# Patient Record
Sex: Female | Born: 2000 | Race: Black or African American | Hispanic: No | Marital: Single | Smoking: Current some day smoker
Health system: Southern US, Community
[De-identification: ages and names within clinical notes are randomized; demographics above are authoritative.]

## PROBLEM LIST (undated history)

## (undated) DIAGNOSIS — F32A Depression, unspecified: Secondary | ICD-10-CM

## (undated) DIAGNOSIS — D649 Anemia, unspecified: Secondary | ICD-10-CM

## (undated) DIAGNOSIS — Z5189 Encounter for other specified aftercare: Secondary | ICD-10-CM

## (undated) DIAGNOSIS — F329 Major depressive disorder, single episode, unspecified: Secondary | ICD-10-CM

## (undated) DIAGNOSIS — F913 Oppositional defiant disorder: Secondary | ICD-10-CM

## (undated) DIAGNOSIS — F909 Attention-deficit hyperactivity disorder, unspecified type: Secondary | ICD-10-CM

## (undated) HISTORY — PX: NO PAST SURGERIES: SHX2092

---
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
## 2018-02-08 ENCOUNTER — Encounter (HOSPITAL_COMMUNITY): Payer: Self-pay | Admitting: *Deleted

## 2018-02-08 ENCOUNTER — Emergency Department (HOSPITAL_COMMUNITY)
Admission: EM | Admit: 2018-02-08 | Discharge: 2018-02-08 | Disposition: A | Discharge status: Home / Self Care | Payer: Medicaid Other | Attending: Emergency Medicine | Admitting: Emergency Medicine

## 2018-02-08 DIAGNOSIS — R112 Nausea with vomiting, unspecified: Secondary | ICD-10-CM | POA: Insufficient documentation

## 2018-02-08 DIAGNOSIS — R1084 Generalized abdominal pain: Secondary | ICD-10-CM | POA: Diagnosis not present

## 2018-02-08 LAB — COMPREHENSIVE METABOLIC PANEL
ALT: 16 U/L (ref 14–54)
AST: 20 U/L (ref 15–41)
Albumin: 4.5 g/dL (ref 3.5–5.0)
Alkaline Phosphatase: 56 U/L (ref 38–126)
Anion gap: 11 (ref 5–15)
BUN: 12 mg/dL (ref 6–20)
CHLORIDE: 103 mmol/L (ref 101–111)
CO2: 25 mmol/L (ref 22–32)
Calcium: 9.4 mg/dL (ref 8.9–10.3)
Creatinine, Ser: 0.76 mg/dL (ref 0.44–1.00)
GFR calc Af Amer: >60 mL/min (ref >60)
Glucose, Bld: 114 mg/dL — ABNORMAL HIGH (ref 65–99)
POTASSIUM: 3.6 mmol/L (ref 3.5–5.1)
Sodium: 139 mmol/L (ref 135–145)
Total Bilirubin: 0.6 mg/dL (ref 0.3–1.2)
Total Protein: 7.9 g/dL (ref 6.5–8.1)

## 2018-02-08 LAB — CBC
HEMATOCRIT: 39.4 % (ref 36.0–46.0)
HEMOGLOBIN: 13.3 g/dL (ref 12.0–15.0)
MCH: 29.2 pg (ref 26.0–34.0)
MCHC: 33.8 g/dL (ref 30.0–36.0)
MCV: 86.4 fL (ref 78.0–100.0)
Platelets: 211 10*3/uL (ref 150–400)
RBC: 4.56 MIL/uL (ref 3.87–5.11)
RDW: 12.8 % (ref 11.5–15.5)
WBC: 7.9 10*3/uL (ref 4.0–10.5)

## 2018-02-08 LAB — URINALYSIS, ROUTINE W REFLEX MICROSCOPIC
BILIRUBIN URINE: NEGATIVE
Glucose, UA: NEGATIVE mg/dL
HGB URINE DIPSTICK: NEGATIVE
KETONES UR: 5 mg/dL — AB
LEUKOCYTES UA: NEGATIVE
NITRITE: NEGATIVE
PROTEIN: 30 mg/dL — AB
Specific Gravity, Urine: 1.026 (ref 1.005–1.030)
pH: 7 (ref 5.0–8.0)

## 2018-02-08 LAB — POC URINE PREG, ED: PREG TEST UR: NEGATIVE

## 2018-02-08 LAB — LIPASE, BLOOD: LIPASE: 26 U/L (ref 11–51)

## 2018-02-08 MED ORDER — ONDANSETRON HCL 4 MG/2ML IJ SOLN
4.0000 mg | Freq: Once | INTRAMUSCULAR | Status: AC
  Administered 2018-02-08: 4 mg via INTRAVENOUS
  Filled 2018-02-08: qty 2

## 2018-02-08 MED ORDER — ONDANSETRON 4 MG PO TBDP: 4.0000 mg | ORAL_TABLET | Freq: Three times a day (TID) | ORAL | 0 refills | Status: DC | PRN

## 2018-02-08 MED ORDER — SODIUM CHLORIDE 0.9 % IV BOLUS
500.0000 mL | Freq: Once | INTRAVENOUS | Status: AC
  Administered 2018-02-08: 500 mL via INTRAVENOUS
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
## 2018-02-08 NOTE — Discharge Instructions (Signed)
As discussed, make sure that you stay well-hydrated start reintroducing fluids and then move on to bland foods.  Take Zofran as needed for nausea vomiting. Follow-up with your primary care provider. Return sooner if symptoms worsen or new concerning symptoms in the meantime.

## 2018-02-08 NOTE — ED Provider Notes (Signed)
[facility]-EMERGENCY DEPT Provider Note   CSN: [id number] Arrival date & time: 02/08/18  1502     History   Chief Complaint Chief Complaint  Patient presents with  . Abdominal Pain  . Emesis    HPI Annette Archer is a 17 y.o. female with past medical history significant for ADHD presenting with generalized abdominal discomfort, nausea/vomiting today.  She is accompanied by [facility] and was sent by the jail nurse who witnessed her vomiting. She states that she was told she needed to be evaluated prior to being accepted to the detention facility. She reports that she drank a lot of alcohol yesterday for her birthday and feels hung over.  She denies any focal abdominal pain. No fever, diarrhea, dysuria, hematuria, vaginal discharge.  LMP unknown patient reports irregular cycles.  HPI  Past Medical History:  Diagnosis Date  . ADHD (attention deficit hyperactivity disorder)   . ADHD (attention deficit hyperactivity disorder)   . Depression   . ODD (oppositional defiant disorder)     Patient Active Problem List   Diagnosis Date Noted  . ODD (oppositional defiant disorder)     History reviewed. No pertinent surgical history.   OB History   None      Home Medications    Prior to Admission medications   Medication Sig Start Date End Date Taking? Authorizing Provider  cephALEXin (KEFLEX) 500 MG capsule Take 1 capsule (500 mg total) by mouth 4 (four) times daily. Patient not taking: Reported on 12/20/2017 10/15/15   Mackuen, Courteney Lyn, MD  ibuprofen (ADVIL,MOTRIN) 600 MG tablet Take 1 tablet (600 mg total) by mouth every 6 (six) hours as needed for moderate pain. 12/20/17   Antony Madura, PA-C  ondansetron (ZOFRAN ODT) 4 MG disintegrating tablet Take 1 tablet (4 mg total) by mouth every 8 (eight) hours as needed for nausea or vomiting. 02/08/18   Georgiana Shore, PA-C  pantoprazole (PROTONIX) 40 MG tablet Take 1 tablet (40 mg total) by mouth daily. Patient  not taking: Reported on 12/20/2017 10/14/17   Ward, Layla Maw, DO  promethazine (PHENERGAN) 25 MG tablet Take 1 tablet (25 mg total) by mouth every 6 (six) hours as needed for nausea or vomiting. 12/20/17   Antony Madura, PA-C    Family History No family history on file.  Social History Social History   Tobacco Use  . Smoking status: Never Smoker  . Smokeless tobacco: Never Used  Substance Use Topics  . Alcohol use: No  . Drug use: Yes    Types: Marijuana     Allergies   Caffeine; Red dye; and Yellow dyes (non-tartrazine)   Review of Systems Review of Systems  Constitutional: Negative for chills, diaphoresis, fatigue and fever.  Respiratory: Negative for cough, choking, chest tightness, shortness of breath, wheezing and stridor.   Cardiovascular: Negative for chest pain and palpitations.  Gastrointestinal: Positive for abdominal distention, nausea and vomiting. Negative for abdominal pain, blood in stool, constipation and diarrhea.       Patient reported generalized abdominal discomfort with nausea and vomiting earlier which have now resolved.  Normal bowel movement yesterday  Genitourinary: Negative for difficulty urinating, dysuria, flank pain, frequency, hematuria, pelvic pain, vaginal bleeding and vaginal discharge.  Musculoskeletal: Negative for arthralgias, back pain, gait problem, joint swelling, myalgias, neck pain and neck stiffness.  Skin: Negative for color change, pallor and rash.  Neurological: Negative for dizziness, seizures, syncope, weakness, light-headedness and headaches.     Physical Exam Updated Vital Signs BP  117/61 (BP Location: Left Arm)   Pulse 65   Temp 98.4 F (36.9 C) (Oral)   Resp 12   Ht 5\' 4"  (1.626 m)   Wt 59 kg (130 lb)   SpO2 100%   BMI 22.31 kg/m   Physical Exam  Constitutional: She appears well-developed and well-nourished.  Non-toxic appearance. She does not appear ill. No distress.  Afebrile, nontoxic-appearing, sitting  comfortably in bed no acute distress.  HENT:  Head: Normocephalic and atraumatic.  Mouth/Throat: Oropharynx is clear and moist. No oropharyngeal exudate.  Eyes: Conjunctivae and EOM are normal. No scleral icterus.  Neck: Neck supple.  Cardiovascular: Normal rate, regular rhythm and normal heart sounds.  No murmur heard. Pulmonary/Chest: Effort normal and breath sounds normal. No stridor. No respiratory distress. She has no wheezes. She has no rales.  Abdominal: Soft. Normal appearance and bowel sounds are normal. She exhibits no distension, no pulsatile midline mass and no mass. There is no tenderness. There is no rigidity, no rebound, no guarding, no CVA tenderness, no tenderness at McBurney's point and negative Murphy's sign.  Musculoskeletal: She exhibits no edema.  Neurological: She is alert.  Skin: Skin is warm and dry. No rash noted. She is not diaphoretic. No erythema. No pallor.  Psychiatric: She has a normal mood and affect.  Nursing note and vitals reviewed.    ED Treatments / Results  Labs (all labs ordered are listed, but only abnormal results are displayed) Labs Reviewed  COMPREHENSIVE METABOLIC PANEL - Abnormal; Notable for the following components:      Result Value   Glucose, Bld 114 (*)    All other components within normal limits  URINALYSIS, ROUTINE W REFLEX MICROSCOPIC - Abnormal; Notable for the following components:   APPearance HAZY (*)    Ketones, ur 5 (*)    Protein, ur 30 (*)    Bacteria, UA RARE (*)    Squamous Epithelial / LPF 6-30 (*)    All other components within normal limits  LIPASE, BLOOD  CBC  POC URINE PREG, ED    EKG None  Radiology No results found.  Procedures Procedures (including critical care time)  Medications Ordered in ED Medications  ondansetron (ZOFRAN) injection 4 mg (4 mg Intravenous Given 02/08/18 2051)  sodium chloride 0.9 % bolus 500 mL (0 mLs Intravenous Stopped 02/08/18 2143)     Initial Impression / Assessment  and Plan / ED Course  I have reviewed the triage vital signs and the nursing notes.  Pertinent labs & imaging results that were available during my care of the patient were reviewed by me and considered in my medical decision making (see chart for details).    Patient presented with [facility] sent by nurse from detention facility due to vomiting.  She has been attributing this to heavy drinking yesterday.  No focal abdominal pain.  Abdomen is soft and nontender on palpation.  Afebrile, nontoxic with normal vital signs.  Patient was given Zofran and IV fluids reported improvement. She is asking for food. Labs unremarkable On reassessment, patient reports improvement no vomiting or abdominal pain.  Successful p.o. Challenge.  Will discharge with symptomatic relief and close follow-up with PCP with Zofran as needed.  Discussed strict return precautions and advised to return to the emergency department if experiencing any new or worsening symptoms. Instructions were understood and patient agreed with discharge plan. Final Clinical Impressions(s) / ED Diagnoses   Final diagnoses:  Non-intractable vomiting with nausea, unspecified vomiting type  Generalized abdominal pain  ED Discharge Orders        Ordered    ondansetron (ZOFRAN ODT) 4 MG disintegrating tablet  Every 8 hours PRN     02/08/18 2229       Georgiana ShoreMitchell, Glyn Gerads B, PA-C 02/08/18 2302    Melene PlanFloyd, Dan, DO 02/09/18 0003

## 2018-02-08 NOTE — ED Triage Notes (Signed)
Nurse from jail sent pt to be evaluated for abdominal pain and emesis. Pt states she feels like she has a really bad hangover. Pt states she drank a lot of alcohol last night.

## 2018-10-17 ENCOUNTER — Other Ambulatory Visit: Payer: Self-pay

## 2018-10-17 ENCOUNTER — Emergency Department (HOSPITAL_COMMUNITY)
Admission: EM | Admit: 2018-10-17 | Discharge: 2018-10-18 | Disposition: A | Discharge status: Home / Self Care | Payer: Medicaid Other | Attending: Emergency Medicine | Admitting: Emergency Medicine

## 2018-10-17 DIAGNOSIS — F1910 Other psychoactive substance abuse, uncomplicated: Secondary | ICD-10-CM | POA: Insufficient documentation

## 2018-10-17 DIAGNOSIS — F319 Bipolar disorder, unspecified: Secondary | ICD-10-CM | POA: Insufficient documentation

## 2018-10-17 DIAGNOSIS — Z046 Encounter for general psychiatric examination, requested by authority: Secondary | ICD-10-CM | POA: Insufficient documentation

## 2018-10-17 NOTE — ED Notes (Signed)
Bed: [facility] Expected date:  Expected time:  Means of arrival:  Comments: 
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
## 2018-10-18 ENCOUNTER — Encounter (HOSPITAL_COMMUNITY): Payer: Self-pay | Admitting: Emergency Medicine

## 2018-10-18 LAB — COMPREHENSIVE METABOLIC PANEL
ALK PHOS: 62 U/L (ref 38–126)
ALT: 63 U/L — ABNORMAL HIGH (ref 0–44)
ANION GAP: 6 (ref 5–15)
AST: 41 U/L (ref 15–41)
Albumin: 4 g/dL (ref 3.5–5.0)
BUN: 11 mg/dL (ref 6–20)
CALCIUM: 8.5 mg/dL — AB (ref 8.9–10.3)
CO2: 27 mmol/L (ref 22–32)
Chloride: 107 mmol/L (ref 98–111)
Creatinine, Ser: 0.79 mg/dL (ref 0.44–1.00)
GFR calc Af Amer: >60 mL/min (ref >60)
GFR calc non Af Amer: >60 mL/min (ref >60)
GLUCOSE: 86 mg/dL (ref 70–99)
Potassium: 4.3 mmol/L (ref 3.5–5.1)
Sodium: 140 mmol/L (ref 135–145)
Total Bilirubin: 0.6 mg/dL (ref 0.3–1.2)
Total Protein: 6.8 g/dL (ref 6.5–8.1)

## 2018-10-18 LAB — I-STAT BETA HCG BLOOD, ED (MC, WL, AP ONLY)

## 2018-10-18 LAB — CBC
HCT: 37.6 % (ref 36.0–46.0)
HEMOGLOBIN: 12.9 g/dL (ref 12.0–15.0)
MCH: 30 pg (ref 26.0–34.0)
MCHC: 34.3 g/dL (ref 30.0–36.0)
MCV: 87.4 fL (ref 80.0–100.0)
NRBC: 0 % (ref 0.0–0.2)
PLATELETS: 195 10*3/uL (ref 150–400)
RBC: 4.3 MIL/uL (ref 3.87–5.11)
RDW: 13.1 % (ref 11.5–15.5)
WBC: 4.3 10*3/uL (ref 4.0–10.5)

## 2018-10-18 LAB — ETHANOL: Alcohol, Ethyl (B): <10 mg/dL (ref <10)

## 2018-10-18 MED ORDER — ONDANSETRON HCL 4 MG PO TABS: 4.0000 mg | ORAL_TABLET | Freq: Three times a day (TID) | ORAL | Status: DC | PRN

## 2018-10-18 MED ORDER — ACETAMINOPHEN 325 MG PO TABS: 650.0000 mg | ORAL_TABLET | ORAL | Status: DC | PRN

## 2018-10-18 MED ORDER — ALUM & MAG HYDROXIDE-SIMETH 200-200-20 MG/5ML PO SUSP: 30.0000 mL | Freq: Four times a day (QID) | ORAL | Status: DC | PRN
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
## 2018-10-18 NOTE — ED Notes (Signed)
Patient being d/c'd. Patient stable for d/c. Called patient's April HoldingGrandmother, Linda Guy, at [phone] to pick up patient.
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
## 2018-10-18 NOTE — ED Notes (Addendum)
Patient's BP 88/62 manually. Notified Dr. Read DriversMolpus. He stated okay for patient to d/c as long as she could ambulate without getting dizzy. She ambulated without getting dizzy. Patient stable for d/c.

## 2019-02-12 ENCOUNTER — Ambulatory Visit (HOSPITAL_COMMUNITY)
Admission: EM | Admit: 2019-02-12 | Discharge: 2019-02-12 | Disposition: A | Discharge status: Home / Self Care | Payer: Medicaid Other | Attending: Family Medicine | Admitting: Family Medicine

## 2019-02-12 ENCOUNTER — Encounter (HOSPITAL_COMMUNITY): Payer: Self-pay | Admitting: Emergency Medicine

## 2019-02-12 ENCOUNTER — Other Ambulatory Visit: Payer: Self-pay

## 2019-02-12 DIAGNOSIS — Z113 Encounter for screening for infections with a predominantly sexual mode of transmission: Secondary | ICD-10-CM | POA: Diagnosis present

## 2019-02-12 DIAGNOSIS — A6004 Herpesviral vulvovaginitis: Secondary | ICD-10-CM | POA: Diagnosis present

## 2019-02-12 MED ORDER — VALACYCLOVIR HCL 1 G PO TABS: 1000.0000 mg | ORAL_TABLET | Freq: Three times a day (TID) | ORAL | 0 refills | Status: DC

## 2019-02-12 NOTE — ED Provider Notes (Signed)
[facility]   [id number] 02/12/19 Arrival Time: 1047  ASSESSMENT & PLAN:  1. Herpes simplex vulvovaginitis   2. Screening for STDs (sexually transmitted diseases)    See AVS for written information. No signs or symptoms of PID.   Discharge Instructions     We have sent testing for sexually transmitted infections. We will notify you of any positive results once they are received. If required, we will prescribe any medications you might need.  Please refrain from all sexual activity for at least the next seven days.  I have included written information on genital herpes. Please feel free to call with any further questions or concerns you may have.     Pending: Labs Reviewed  RPR  HIV ANTIBODY (ROUTINE TESTING W REFLEX)  CERVICOVAGINAL ANCILLARY ONLY   Meds ordered this encounter  Medications  . valACYclovir (VALTREX) 1000 MG tablet    Sig: Take 1 tablet (1,000 mg total) by mouth 3 (three) times daily.    Dispense:  21 tablet    Refill:  0   Will notify of any positive results of screening tests. Instructed to refrain from sexual activity for at least seven days.  Reviewed expectations re: course of current medical issues. Questions answered. Outlined signs and symptoms indicating need for more acute intervention. Patient verbalized understanding. After Visit Summary given.   SUBJECTIVE:  Annette Archer is a 18 y.o. female who presents with complaint of painful "bumps" of perineum. Noticed this morning. Recent unprotected sex with a female partner who has had genital herpes. She is worried this may be herpes. No vaginal discharge reported. Denies: urinary frequency, hematuria, nausea, vomiting and dysuria. Afebrile. No abdominal or pelvic pain. No n/v. No rashes or lesions. OTC treatment: none reported. History of STI: none reported.  Patient's last menstrual period was 01/29/2019.  ROS: As per HPI. All other systems negative.   OBJECTIVE:  Vitals:   02/12/19 1113  BP: 107/73  Pulse: 76  Resp: 18  Temp: 99.3 F (37.4 C)  TempSrc: Oral  SpO2: 95%    General appearance: alert, cooperative, appears stated age and no distress Throat: lips, mucosa, and tongue normal; teeth and gums normal CV: RRR Lungs: CTAB Back: no CVA tenderness; FROM at waist Abdomen: soft, non-tender GU: two crops of shallow round ulcerations measuring approx 2-3 mm each over lower external vagina and around rectum; tender to touch; no bleeding; no areas of fluctuance; no vaginal discharge Skin: warm and dry Psychological: alert and cooperative; normal mood and affect.  Labs Reviewed - No data to display  Allergies  Allergen Reactions  . Caffeine Other (See Comments)    insomnia  . Red Dye     Can not take because of adderall  . Yellow Dyes (Non-Tartrazine) Other (See Comments)    Per pts mother she can not have because she takes adderall    Past Medical History:  Diagnosis Date  . ADHD (attention deficit hyperactivity disorder)   . Depression   . ODD (oppositional defiant disorder)    FH: Unknown.  Social History   Socioeconomic History  . Marital status: Single    Spouse name: Not on file  . Number of children: Not on file  . Years of education: Not on file  . Highest education level: Not on file  Occupational History  . Not on file  Social Needs  . Financial resource strain: Not on file  . Food insecurity:    Worry: Not on file  Inability: Not on file  . Transportation needs:    Medical: Not on file    Non-medical: Not on file  Tobacco Use  . Smoking status: Never Smoker  . Smokeless tobacco: Never Used  Substance and Sexual Activity  . Alcohol use: No  . Drug use: Yes    Types: Marijuana  . Sexual activity: Never  Lifestyle  . Physical activity:    Days per week: Not on file    Minutes per session: Not on file  . Stress: Not on file  Relationships  . Social connections:    Talks on phone: Not on file    Gets together:  Not on file    Attends religious service: Not on file    Active member of club or organization: Not on file    Attends meetings of clubs or organizations: Not on file    Relationship status: Not on file  . Intimate partner violence:    Fear of current or ex partner: Not on file    Emotionally abused: Not on file    Physically abused: Not on file    Forced sexual activity: Not on file  Other Topics Concern  . Not on file  Social History Narrative  . Not on file          Mardella Layman, MD 02/13/19 [phone]

## 2019-02-12 NOTE — ED Notes (Signed)
At bedside for physician evaluation of ulcers /bumps of vagina/rectum.

## 2019-02-12 NOTE — ED Triage Notes (Signed)
Recent incarceration.  Patient has had unprotected sex with a partner.  Partner has herpes.  Patient thinks there are bumps/blister to perineum.  Noticed these areas this morning.  Blisters at rectum too

## 2019-02-12 NOTE — Discharge Instructions (Signed)
We have sent testing for sexually transmitted infections. We will notify you of any positive results once they are received. If required, we will prescribe any medications you might need.  Please refrain from all sexual activity for at least the next seven days.  I have included written information on genital herpes. Please feel free to call with any further questions or concerns you may have.

## 2019-02-13 LAB — CERVICOVAGINAL ANCILLARY ONLY
Bacterial vaginitis: POSITIVE — AB
Candida vaginitis: POSITIVE — AB
Chlamydia: NEGATIVE
Neisseria Gonorrhea: NEGATIVE
Trichomonas: NEGATIVE

## 2019-02-13 LAB — HIV ANTIBODY (ROUTINE TESTING W REFLEX): HIV Screen 4th Generation wRfx: NONREACTIVE

## 2019-02-13 LAB — RPR: RPR Ser Ql: NONREACTIVE

## 2019-02-14 ENCOUNTER — Telehealth (HOSPITAL_COMMUNITY): Payer: Self-pay | Admitting: Emergency Medicine

## 2019-02-14 MED ORDER — FLUCONAZOLE 150 MG PO TABS: 150.0000 mg | ORAL_TABLET | Freq: Once | ORAL | 0 refills | Status: AC

## 2019-02-14 MED ORDER — METRONIDAZOLE 500 MG PO TABS: 500.0000 mg | ORAL_TABLET | Freq: Two times a day (BID) | ORAL | 0 refills | Status: AC

## 2019-02-14 NOTE — Telephone Encounter (Signed)
Bacterial vaginosis is positive. This was not treated at the urgent care visit.  Flagyl 500 mg BID x 7 days #14 no refills sent to patients pharmacy of choice.    Test for candida (yeast) was positive.  Prescription for fluconazole 150mg po now, repeat dose in 3d if needed, #2 no refills, sent to the pharmacy of record.  Recheck or followup with PCP for further evaluation if symptoms are not improving.    Patient contacted and made aware of all results, all questions answered.   

## 2019-03-13 ENCOUNTER — Ambulatory Visit (HOSPITAL_COMMUNITY)
Admission: EM | Admit: 2019-03-13 | Discharge: 2019-03-13 | Disposition: A | Discharge status: Home / Self Care | Payer: Medicaid Other | Attending: Family Medicine | Admitting: Family Medicine

## 2019-03-13 ENCOUNTER — Encounter (HOSPITAL_COMMUNITY): Payer: Self-pay

## 2019-03-13 ENCOUNTER — Other Ambulatory Visit: Payer: Self-pay

## 2019-03-13 DIAGNOSIS — S60429A Blister (nonthermal) of unspecified finger, initial encounter: Secondary | ICD-10-CM | POA: Diagnosis present

## 2019-03-13 DIAGNOSIS — A6004 Herpesviral vulvovaginitis: Secondary | ICD-10-CM | POA: Diagnosis present

## 2019-03-13 DIAGNOSIS — N760 Acute vaginitis: Secondary | ICD-10-CM | POA: Diagnosis present

## 2019-03-13 LAB — POCT PREGNANCY, URINE: Preg Test, Ur: NEGATIVE

## 2019-03-13 MED ORDER — VALACYCLOVIR HCL 1 G PO TABS: 1000.0000 mg | ORAL_TABLET | Freq: Every day | ORAL | 0 refills | Status: AC
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
## 2019-03-13 NOTE — ED Notes (Signed)
Period is late
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
## 2019-03-13 NOTE — Discharge Instructions (Signed)
Negative pregnancy.  Complete 7 days of valtrex for herpes.  I expect this likely to help your finger as well.  Elevation and ibuprofen for pain as needed.  Will notify you of any further positive findings from your vaginal test and if any changes to treatment are needed.   Please without from intercourse until your symptoms have resolved.  Please follow up with your primary care provider for management as needed.

## 2019-03-14 ENCOUNTER — Telehealth (HOSPITAL_COMMUNITY): Payer: Self-pay | Admitting: Emergency Medicine

## 2019-03-14 LAB — CERVICOVAGINAL ANCILLARY ONLY
Bacterial vaginitis: POSITIVE — AB
Candida vaginitis: POSITIVE — AB
Chlamydia: NEGATIVE
Neisseria Gonorrhea: NEGATIVE
Trichomonas: NEGATIVE

## 2019-03-14 MED ORDER — FLUCONAZOLE 150 MG PO TABS: 150.0000 mg | ORAL_TABLET | Freq: Once | ORAL | 0 refills | Status: AC

## 2019-03-14 MED ORDER — METRONIDAZOLE 500 MG PO TABS: 500.0000 mg | ORAL_TABLET | Freq: Two times a day (BID) | ORAL | 0 refills | Status: AC

## 2019-03-14 NOTE — Telephone Encounter (Signed)
Bacterial vaginosis is positive. This was not treated at the urgent care visit.  Flagyl 500 mg BID x 7 days #14 no refills sent to patients pharmacy of choice.    Test for candida (yeast) was positive.  Prescription for fluconazole 150mg po now, repeat dose in 3d if needed, #2 no refills, sent to the pharmacy of record.  Recheck or followup with PCP for further evaluation if symptoms are not improving.    Patient contacted and made aware of all results, all questions answered.   
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
## 2019-11-26 ENCOUNTER — Ambulatory Visit (HOSPITAL_COMMUNITY)
Admission: EM | Admit: 2019-11-26 | Discharge: 2019-11-26 | Disposition: A | Discharge status: Home / Self Care | Payer: Medicaid Other | Attending: Family Medicine | Admitting: Family Medicine

## 2019-11-26 ENCOUNTER — Encounter (HOSPITAL_COMMUNITY): Payer: Self-pay

## 2019-11-26 DIAGNOSIS — J029 Acute pharyngitis, unspecified: Secondary | ICD-10-CM | POA: Diagnosis not present

## 2019-11-26 LAB — POCT RAPID STREP A: Streptococcus, Group A Screen (Direct): NEGATIVE

## 2019-11-26 NOTE — Discharge Instructions (Addendum)
Your strep test was negative.  This is mostly likely some sort of viral illness.  I do not believe this is herpes related. Follow up as needed for continued or worsening symptoms

## 2019-11-26 NOTE — ED Triage Notes (Signed)
Pt present a Herpes outbreak in her throat, on Sunday her throat was red and inflamed with bump. Pt state those are the same outbreak she has on her vaginal when she has an outbreak.

## 2019-11-27 NOTE — ED Provider Notes (Signed)
[facility]    CSN: [id number] Arrival date & time: 11/26/19  1339      History   Chief Complaint Chief Complaint  Patient presents with  . Nausea  . Sore Throat    HPI Annette Archer is a 19 y.o. female.   19 year old female presents today for sore throat.  Symptoms been constant, improving for the past 3 days.  Reporting pain with swallowing and noticed white patches in the back of her throat.  Denies any fever, cough, nasal congestion or rhinorrhea.  She is not take anything for her symptoms.  She denies any sick contacts or Covid exposures.  Concerned for oral herpes flare. 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  She is not currently sexually active.  ROS per HPI      Past Medical History:  Diagnosis Date  . ADHD (attention deficit hyperactivity disorder)   . Depression   . ODD (oppositional defiant disorder)     Patient Active Problem List   Diagnosis Date Noted  . ODD (oppositional defiant disorder)     History reviewed. No pertinent surgical history.  OB History   No obstetric history on file.      Home Medications    Prior to Admission medications   Not on File    Family History Family History  Problem Relation Age of Onset  . Healthy Mother   . Healthy Father     Social History Social History   Tobacco Use  . Smoking status: Never Smoker  . Smokeless tobacco: Never Used  Substance Use Topics  . Alcohol use: Yes  . Drug use: Not Currently    Types: Marijuana     Allergies   Caffeine, Red dye, and Yellow dyes (non-tartrazine)   Review of Systems Review of Systems   Physical Exam Triage Vital Signs ED Triage Vitals  Enc Vitals Group     BP 11/26/19 1413 107/61     Pulse Rate 11/26/19 1413 72     Resp 11/26/19 1413 18     Temp 11/26/19 1413 99.1 F (37.3 C)     Temp Source 11/26/19 1413 Oral     SpO2 11/26/19 1413 100 %     Weight --      Height --      Head Circumference --      Peak Flow --      Pain Score  11/26/19 1414 3     Pain Loc --      Pain Edu? --      Excl. in [place]? --    No data found.  Updated Vital Signs BP 107/61 (BP Location: Right Arm)   Pulse 72   Temp 99.1 F (37.3 C) (Oral)   Resp 18   SpO2 100%   Visual Acuity Right Eye Distance:   Left Eye Distance:   Bilateral Distance:    Right Eye Near:   Left Eye Near:    Bilateral Near:     Physical Exam Constitutional:      General: She is not in acute distress.    Appearance: She is well-developed. She is not ill-appearing, toxic-appearing or diaphoretic.  HENT:     Head: Normocephalic and atraumatic.     Right Ear: Tympanic membrane and ear canal normal.     Left Ear: Tympanic membrane and ear canal normal.     Mouth/Throat:     Pharynx: Oropharynx is clear. Uvula midline. Posterior oropharyngeal erythema present. No uvula swelling.  Tonsils: No tonsillar exudate. 1+ on the right. 1+ on the left.  Eyes:     Conjunctiva/sclera: Conjunctivae normal.  Pulmonary:     Effort: Pulmonary effort is normal.  Lymphadenopathy:     Cervical: No cervical adenopathy.  Skin:    General: Skin is warm and dry.  Neurological:     Mental Status: She is alert.  Psychiatric:        Mood and Affect: Mood normal.      UC Treatments / Results  Labs (all labs ordered are listed, but only abnormal results are displayed) Labs Reviewed  CULTURE, GROUP A STREP [facility])  POCT RAPID STREP A    EKG   Radiology No results found.  Procedures Procedures (including critical care time)  Medications Ordered in UC Medications - No data to display  Initial Impression / Assessment and Plan / UC Course  I have reviewed the triage vital signs and the nursing notes.  Pertinent labs & imaging results that were available during my care of the patient were reviewed by me and considered in my medical decision making (see chart for details).     Sore throat- exam benign  Rapid strep test negative.  Patient reassured this is not  herpes flare Most likely viral illness She can take OTC meds as needed for symptoms.  Final Clinical Impressions(s) / UC Diagnoses   Final diagnoses:  Sore throat     Discharge Instructions     Your strep test was negative.  This is mostly likely some sort of viral illness.  I do not believe this is herpes related. Follow up as needed for continued or worsening symptoms     ED Prescriptions    None     PDMP not reviewed this encounter.   Janace Aris, NP 11/27/19 1032

## 2019-11-28 LAB — CULTURE, GROUP A STREP (THRC)

## 2019-12-20 ENCOUNTER — Ambulatory Visit: Payer: Medicaid Other | Admitting: Internal Medicine

## 2019-12-29 ENCOUNTER — Other Ambulatory Visit: Payer: Self-pay

## 2019-12-29 ENCOUNTER — Encounter (HOSPITAL_COMMUNITY): Payer: Self-pay

## 2019-12-29 ENCOUNTER — Ambulatory Visit (HOSPITAL_COMMUNITY)
Admission: EM | Admit: 2019-12-29 | Discharge: 2019-12-29 | Disposition: A | Discharge status: Home / Self Care | Payer: Medicaid Other | Attending: Family Medicine | Admitting: Family Medicine

## 2019-12-29 DIAGNOSIS — R112 Nausea with vomiting, unspecified: Secondary | ICD-10-CM | POA: Diagnosis not present

## 2019-12-29 DIAGNOSIS — K219 Gastro-esophageal reflux disease without esophagitis: Secondary | ICD-10-CM

## 2019-12-29 MED ORDER — ONDANSETRON HCL 4 MG PO TABS: 4.0000 mg | ORAL_TABLET | Freq: Four times a day (QID) | ORAL | 0 refills | Status: DC

## 2019-12-29 NOTE — ED Triage Notes (Signed)
Pt presents with heartburn (epigastric burning sensation) , nausea, and vomiting since yesterday.

## 2019-12-29 NOTE — Discharge Instructions (Signed)
You may take OTC pepcid or protonix for GERD.  Avoid spicy, acidic foods.  Follow up with primary care as needed.

## 2019-12-30 NOTE — ED Provider Notes (Signed)
[facility]    CSN: [id number] Arrival date & time: 12/29/19  1549      History   Chief Complaint Chief Complaint  Patient presents with  . Gastroesophageal Reflux  . Nausea  . Vomiting    HPI Annette Archer is a 19 y.o. female.   Reports that she has been experiencing heartburn for the last 3 weeks. Reports history of GERD and has taken medication for this but cannot recall the name of the medication. Reports that she has also been eating a lot of fast food lately and thinks that this may be contributing to her symptoms. Reports some regurgitation with belching. Denies attempts to treat at home. Denies fever, HA, ST, SOB, chest pain, chills, myalgias, rash, other symptoms.   ROS per HPI  The history is provided by the patient.  Gastroesophageal Reflux    Past Medical History:  Diagnosis Date  . ADHD (attention deficit hyperactivity disorder)   . Depression   . ODD (oppositional defiant disorder)     Patient Active Problem List   Diagnosis Date Noted  . ODD (oppositional defiant disorder)     History reviewed. No pertinent surgical history.  OB History   No obstetric history on file.      Home Medications    Prior to Admission medications   Medication Sig Start Date End Date Taking? Authorizing Provider  ondansetron (ZOFRAN) 4 MG tablet Take 1 tablet (4 mg total) by mouth every 6 (six) hours. 12/29/19   Moshe Cipro, NP    Family History Family History  Problem Relation Age of Onset  . Healthy Mother   . Healthy Father     Social History Social History   Tobacco Use  . Smoking status: Never Smoker  . Smokeless tobacco: Never Used  Substance Use Topics  . Alcohol use: Yes  . Drug use: Not Currently    Types: Marijuana     Allergies   Caffeine, Red dye, and Yellow dyes (non-tartrazine)   Review of Systems Review of Systems   Physical Exam Triage Vital Signs ED Triage Vitals  Enc Vitals Group     BP 12/29/19 1625  99/63     Pulse Rate 12/29/19 1625 79     Resp 12/29/19 1625 17     Temp 12/29/19 1625 98.5 F (36.9 C)     Temp Source 12/29/19 1625 Oral     SpO2 12/29/19 1625 100 %     Weight --      Height --      Head Circumference --      Peak Flow --      Pain Score 12/29/19 1629 4     Pain Loc --      Pain Edu? --      Excl. in GC? --    No data found.  Updated Vital Signs BP 99/63 (BP Location: Right Arm)   Pulse 79   Temp 98.5 F (36.9 C) (Oral)   Resp 17   SpO2 100%   Visual Acuity Right Eye Distance:   Left Eye Distance:   Bilateral Distance:    Right Eye Near:   Left Eye Near:    Bilateral Near:     Physical Exam Vitals and nursing note reviewed.  Constitutional:      General: She is not in acute distress.    Appearance: Normal appearance. She is well-developed.  HENT:     Head: Normocephalic and atraumatic.     Right Ear: Tympanic  membrane normal.     Left Ear: Tympanic membrane normal.     Mouth/Throat:     Mouth: Mucous membranes are moist.  Eyes:     Conjunctiva/sclera: Conjunctivae normal.  Cardiovascular:     Rate and Rhythm: Normal rate and regular rhythm.     Heart sounds: No murmur.  Pulmonary:     Effort: Pulmonary effort is normal. No respiratory distress.     Breath sounds: Normal breath sounds.  Abdominal:     General: Bowel sounds are normal. There is no distension.     Palpations: Abdomen is soft. There is no mass.     Tenderness: There is no abdominal tenderness.     Hernia: No hernia is present.  Musculoskeletal:        General: Normal range of motion.     Cervical back: Normal range of motion and neck supple.  Skin:    General: Skin is warm and dry.  Neurological:     General: No focal deficit present.     Mental Status: She is alert and oriented to person, place, and time.  Psychiatric:        Mood and Affect: Mood normal.        Behavior: Behavior normal.      UC Treatments / Results  Labs (all labs ordered are listed, but  only abnormal results are displayed) Labs Reviewed - No data to display  EKG   Radiology No results found.  Procedures Procedures (including critical care time)  Medications Ordered in UC Medications - No data to display  Initial Impression / Assessment and Plan / UC Course  I have reviewed the triage vital signs and the nursing notes.  Pertinent labs & imaging results that were available during my care of the patient were reviewed by me and considered in my medical decision making (see chart for details).     Most likely recurrence of GERD. Zofran sent in for episodic nausea. Instructed to get OTC antacid and to take dialy x 2 weeks to see if this helps to decrease her symptoms. Instructed about decreasing salt and acidic foods in her diet.Instructed to follow up with primary care as needed. Final Clinical Impressions(s) / UC Diagnoses   Final diagnoses:  Nausea and vomiting, intractability of vomiting not specified, unspecified vomiting type  Gastroesophageal reflux disease without esophagitis     Discharge Instructions     You may take OTC pepcid or protonix for GERD.  Avoid spicy, acidic foods.  Follow up with primary care as needed.     ED Prescriptions    Medication Sig Dispense Auth. Provider   ondansetron (ZOFRAN) 4 MG tablet Take 1 tablet (4 mg total) by mouth every 6 (six) hours. 12 tablet Faustino Congress, NP     I have reviewed the PDMP during this encounter.   Faustino Congress, NP 12/30/19 1118

## 2020-01-31 ENCOUNTER — Encounter (HOSPITAL_COMMUNITY): Payer: Self-pay | Admitting: Emergency Medicine

## 2020-01-31 ENCOUNTER — Other Ambulatory Visit: Payer: Self-pay

## 2020-01-31 ENCOUNTER — Ambulatory Visit (HOSPITAL_COMMUNITY)
Admission: EM | Admit: 2020-01-31 | Discharge: 2020-01-31 | Disposition: A | Discharge status: Home / Self Care | Payer: Medicaid Other | Attending: Internal Medicine | Admitting: Internal Medicine

## 2020-01-31 DIAGNOSIS — Z760 Encounter for issue of repeat prescription: Secondary | ICD-10-CM | POA: Insufficient documentation

## 2020-01-31 DIAGNOSIS — R52 Pain, unspecified: Secondary | ICD-10-CM

## 2020-01-31 DIAGNOSIS — R5383 Other fatigue: Secondary | ICD-10-CM

## 2020-01-31 DIAGNOSIS — Z20822 Contact with and (suspected) exposure to covid-19: Secondary | ICD-10-CM

## 2020-01-31 DIAGNOSIS — U071 COVID-19: Secondary | ICD-10-CM

## 2020-01-31 LAB — SARS CORONAVIRUS 2 (TAT 6-24 HRS): SARS Coronavirus 2: NEGATIVE

## 2020-01-31 MED ORDER — VALACYCLOVIR HCL 1 G PO TABS: ORAL_TABLET | ORAL | 5 refills | Status: DC

## 2020-01-31 NOTE — ED Provider Notes (Signed)
[facility]   MRN: [id number] DOB: 22-Feb-2001  Subjective:   Annette Archer is a 19 y.o. female presenting for medication refill for herpes outbreak.  Patient has also had 3-day history of body aches, fatigue.  She has had mild occasional chest pain.  Unfortunately she found out today that one of her family members that lives with her test positive for COVID-19.  Patient has 2 jobs and would need to note for work.  No current facility-administered medications for this encounter.  Current Outpatient Medications:  .  ondansetron (ZOFRAN) 4 MG tablet, Take 1 tablet (4 mg total) by mouth every 6 (six) hours. (Patient not taking: Reported on 01/31/2020), Disp: 12 tablet, Rfl: 0   Allergies  Allergen Reactions  . Caffeine Other (See Comments)    insomnia  . Red Dye     Can not take because of adderall  . Yellow Dyes (Non-Tartrazine) Other (See Comments)    Per pts mother she can not have because she takes adderall    Past Medical History:  Diagnosis Date  . ADHD (attention deficit hyperactivity disorder)   . Depression   . ODD (oppositional defiant disorder)      History reviewed. No pertinent surgical history.  Family History  Problem Relation Age of Onset  . Healthy Mother   . Healthy Father     Social History   Tobacco Use  . Smoking status: Never Smoker  . Smokeless tobacco: Never Used  Substance Use Topics  . Alcohol use: Yes  . Drug use: Not Currently    Types: Marijuana    ROS   Objective:   Vitals: BP 107/83 (BP Location: Right Arm)   Pulse 74   Temp 98.6 F (37 C) (Oral)   Resp 18   SpO2 98%   Physical Exam Constitutional:      General: She is not in acute distress.    Appearance: Normal appearance. She is well-developed. She is not ill-appearing, toxic-appearing or diaphoretic.  HENT:     Head: Normocephalic and atraumatic.     Nose: Nose normal.     Mouth/Throat:     Mouth: Mucous membranes are moist.  Eyes:     Extraocular  Movements: Extraocular movements intact.     Pupils: Pupils are equal, round, and reactive to light.  Cardiovascular:     Rate and Rhythm: Normal rate and regular rhythm.     Pulses: Normal pulses.     Heart sounds: Normal heart sounds. No murmur. No friction rub. No gallop.   Pulmonary:     Effort: Pulmonary effort is normal. No respiratory distress.     Breath sounds: Normal breath sounds. No stridor. No wheezing, rhonchi or rales.  Skin:    General: Skin is warm and dry.     Findings: No rash.  Neurological:     Mental Status: She is alert and oriented to person, place, and time.  Psychiatric:        Mood and Affect: Mood normal.        Behavior: Behavior normal.        Thought Content: Thought content normal.        Judgment: Judgment normal.     Assessment and Plan :   1. Clinical diagnosis of COVID-19   2. Close exposure to COVID-19 virus   3. Body aches   4. Fatigue, unspecified type     Patient has clinical diagnosis of COVID-19.  She was not wearing mask appropriately despite multiple requests  on my behalf to do so.  Therefore I only performed a cardiovascular and pulmonary exam.  COVID-19 testing is pending.  Provided patient with note for work.  Patient has an active genital herpes outbreak, will have her take Valtrex at 1 g daily for 5 days.  Refills provided. Counseled patient on potential for adverse effects with medications prescribed/recommended today, ER and return-to-clinic precautions discussed, patient verbalized understanding.    Jaynee Eagles, [place] 01/31/20 1931

## 2020-01-31 NOTE — ED Triage Notes (Signed)
Pt here for herpes outbreak; pt needs meds; pt also sts body aches and fatigue x 3 days with possible covid exposure last week

## 2020-02-01 ENCOUNTER — Telehealth (HOSPITAL_COMMUNITY): Payer: Self-pay | Admitting: *Deleted

## 2020-02-01 ENCOUNTER — Telehealth: Payer: Self-pay

## 2020-02-01 NOTE — Telephone Encounter (Signed)
Pt states returning phone call from [facility].  Per documented phone call note info, pt notified of negative Covid result.  Pt inquiring about return to work since 2 household members have tested positive for Covid.  Discussed with Dr Tracie Harrier: if pt is having sxs, despite neg result, [facility] can provide a note stating she can return to work 10 days from sx onset; pt notified and verbalized understanding.  Pt verbalized she does not need a work note at this time.

## 2020-02-01 NOTE — Telephone Encounter (Signed)
Attempted to reach patient. No answer at this time. Unable to leave voicemail.  If pt returns call, please advise that the COVID resulted negative.

## 2020-05-02 ENCOUNTER — Encounter (HOSPITAL_COMMUNITY): Payer: Self-pay

## 2020-05-02 ENCOUNTER — Ambulatory Visit (HOSPITAL_COMMUNITY)
Admission: EM | Admit: 2020-05-02 | Discharge: 2020-05-02 | Disposition: A | Discharge status: Home / Self Care | Payer: Medicaid Other | Attending: Emergency Medicine | Admitting: Emergency Medicine

## 2020-05-02 ENCOUNTER — Other Ambulatory Visit: Payer: Self-pay

## 2020-05-02 DIAGNOSIS — Z20822 Contact with and (suspected) exposure to covid-19: Secondary | ICD-10-CM | POA: Diagnosis not present

## 2020-05-02 DIAGNOSIS — R0981 Nasal congestion: Secondary | ICD-10-CM | POA: Diagnosis present

## 2020-05-02 DIAGNOSIS — J069 Acute upper respiratory infection, unspecified: Secondary | ICD-10-CM | POA: Insufficient documentation

## 2020-05-02 DIAGNOSIS — Z3202 Encounter for pregnancy test, result negative: Secondary | ICD-10-CM

## 2020-05-02 DIAGNOSIS — R112 Nausea with vomiting, unspecified: Secondary | ICD-10-CM | POA: Diagnosis not present

## 2020-05-02 LAB — SARS CORONAVIRUS 2 (TAT 6-24 HRS): SARS Coronavirus 2: NEGATIVE

## 2020-05-02 MED ORDER — ONDANSETRON 4 MG PO TBDP: 4.0000 mg | ORAL_TABLET | Freq: Three times a day (TID) | ORAL | 0 refills | Status: DC | PRN

## 2020-05-02 MED ORDER — TIZANIDINE HCL 4 MG PO TABS: 4.0000 mg | ORAL_TABLET | Freq: Four times a day (QID) | ORAL | 0 refills | Status: DC | PRN

## 2020-05-02 MED ORDER — FLUTICASONE PROPIONATE 50 MCG/ACT NA SUSP: 1.0000 | Freq: Every day | NASAL | 0 refills | Status: DC

## 2020-05-02 MED ORDER — CETIRIZINE-PSEUDOEPHEDRINE ER 5-120 MG PO TB12: 1.0000 | ORAL_TABLET | Freq: Every day | ORAL | 0 refills | Status: DC

## 2020-05-02 NOTE — ED Notes (Signed)
Pregnancy test: Negative

## 2020-05-02 NOTE — ED Triage Notes (Signed)
Pt c/o acute congestion, runny nose, ear fullness,  sneezing, body aches, chills, HA, abdom pain, onset yesterday. N/v this past week with green bile.  Denies sore throat.  Last dose "pain reliever" yesterday.

## 2020-05-02 NOTE — Discharge Instructions (Signed)
Monitor my chart for results of Covid test. Begin Zyrtec-D twice daily along with Flonase nasal spray 1 to 2 spray in each nostril daily consistently over the next week May use over-the-counter Delsym or Robitussin for cough Rest and drink plenty of fluids Zofran for nausea Tizanidine as needed for muscle spasming  Please follow-up if any symptoms not improving or worsening

## 2020-05-03 NOTE — ED Provider Notes (Signed)
[facility]    CSN: [id number] Arrival date & time: 05/02/20  1436      History   Chief Complaint Chief Complaint  Patient presents with  . Nasal Congestion    HPI Annette Archer is a 19 y.o. female presenting today for evaluation of URI symptoms and nausea.  Patient reports that over the past days she has developed congestion, rhinorrhea, ear pressure, sneezing, body aches chills and headache.  She has also had some mild abdominal discomfort.  She has had some intermittent nausea over the past week with occasional vomiting.  She also endorses some muscle twitching.  Denies any close sick contacts.  Denies any known fevers.  Expresses concerns of her menstrual cycle, has not had menstrual cycle in past 3 months.  HPI  Past Medical History:  Diagnosis Date  . ADHD (attention deficit hyperactivity disorder)   . Depression   . ODD (oppositional defiant disorder)     Patient Active Problem List   Diagnosis Date Noted  . ODD (oppositional defiant disorder)     History reviewed. No pertinent surgical history.  OB History   No obstetric history on file.      Home Medications    Prior to Admission medications   Medication Sig Start Date End Date Taking? Authorizing Provider  cetirizine-pseudoephedrine (ZYRTEC-D) 5-120 MG tablet Take 1 tablet by mouth daily. 05/02/20   Tahara Ruffini C, PA-C  fluticasone (FLONASE) 50 MCG/ACT nasal spray Place 1-2 sprays into both nostrils daily for 7 days. 05/02/20 05/09/20  Issam Carlyon C, PA-C  ondansetron (ZOFRAN ODT) 4 MG disintegrating tablet Take 1 tablet (4 mg total) by mouth every 8 (eight) hours as needed for nausea or vomiting. 05/02/20   Karlena Luebke C, PA-C  tiZANidine (ZANAFLEX) 4 MG tablet Take 1 tablet (4 mg total) by mouth every 6 (six) hours as needed for muscle spasms. 05/02/20   Manya Balash C, PA-C  valACYclovir (VALTREX) 1000 MG tablet Take 1 tablet daily for 5 days at the start of an outbreak. 01/31/20    Wallis Bamberg, PA-C    Family History Family History  Problem Relation Age of Onset  . Healthy Mother   . Healthy Father     Social History Social History   Tobacco Use  . Smoking status: Never Smoker  . Smokeless tobacco: Never Used  Substance Use Topics  . Alcohol use: Yes  . Drug use: Not Currently    Types: Marijuana     Allergies   Caffeine, Red dye, and Yellow dyes (non-tartrazine)   Review of Systems Review of Systems  Constitutional: Negative for activity change, appetite change, chills, fatigue and fever.  HENT: Positive for congestion, rhinorrhea, sinus pressure and sore throat. Negative for ear pain and trouble swallowing.   Eyes: Negative for discharge and redness.  Respiratory: Positive for cough. Negative for chest tightness and shortness of breath.   Cardiovascular: Negative for chest pain.  Gastrointestinal: Positive for abdominal pain and nausea. Negative for diarrhea and vomiting.  Musculoskeletal: Negative for myalgias.  Skin: Negative for rash.  Neurological: Negative for dizziness, light-headedness and headaches.     Physical Exam Triage Vital Signs ED Triage Vitals [05/02/20 1535]  Enc Vitals Group     BP 110/71     Pulse Rate 90     Resp 17     Temp 99.3 F (37.4 C)     Temp Source Oral     SpO2 100 %     Weight  Height      Head Circumference      Peak Flow      Pain Score 5     Pain Loc      Pain Edu?      Excl. in GC?    No data found.  Updated Vital Signs BP 110/71 (BP Location: Left Arm)   Pulse 90   Temp 99.3 F (37.4 C) (Oral)   Resp 17   LMP 03/04/2020 (Approximate)   SpO2 100%   Visual Acuity Right Eye Distance:   Left Eye Distance:   Bilateral Distance:    Right Eye Near:   Left Eye Near:    Bilateral Near:     Physical Exam Vitals and nursing note reviewed.  Constitutional:      Appearance: She is well-developed.     Comments: No acute distress  HENT:     Head: Normocephalic and atraumatic.      Ears:     Comments: Bilateral ears without tenderness to palpation of external auricle, tragus and mastoid, EAC's without erythema or swelling, TM's with good bony landmarks and cone of light. Non erythematous. Bilateral effusion    Nose: Nose normal.     Mouth/Throat:     Comments: Oral mucosa pink and moist, no tonsillar enlargement or exudate. Posterior pharynx patent and nonerythematous, no uvula deviation or swelling. Normal phonation. Eyes:     Conjunctiva/sclera: Conjunctivae normal.  Cardiovascular:     Rate and Rhythm: Normal rate.  Pulmonary:     Effort: Pulmonary effort is normal. No respiratory distress.     Comments: Breathing comfortably at rest, CTABL, no wheezing, rales or other adventitious sounds auscultated Abdominal:     General: There is no distension.  Musculoskeletal:        General: Normal range of motion.     Cervical back: Neck supple.  Skin:    General: Skin is warm and dry.  Neurological:     Mental Status: She is alert and oriented to person, place, and time.      UC Treatments / Results  Labs (all labs ordered are listed, but only abnormal results are displayed) Labs Reviewed  SARS CORONAVIRUS 2 (TAT 6-24 HRS)  POC URINE PREG, ED    EKG   Radiology No results found.  Procedures Procedures (including critical care time)  Medications Ordered in UC Medications - No data to display  Initial Impression / Assessment and Plan / UC Course  I have reviewed the triage vital signs and the nursing notes.  Pertinent labs & imaging results that were available during my care of the patient were reviewed by me and considered in my medical decision making (see chart for details).     Covid test pending, 1 day of URI symptoms, exam unremarkable, recommending symptomatic and supportive care as likely viral etiology.  Would expect gradual self resolution.  Pregnancy test negative, use Zofran as needed for nausea.  Continue to monitor for normal  menstrual cycle, follow-up with OB/GYN if continuing to be amenorrheic.  May try tizanidine as needed to help with muscle twitching.  Discussed strict return precautions. Patient verbalized understanding and is agreeable with plan.   Final Clinical Impressions(s) / UC Diagnoses   Final diagnoses:  Viral URI with cough  Non-intractable vomiting with nausea, unspecified vomiting type     Discharge Instructions     Monitor my chart for results of Covid test. Begin Zyrtec-D twice daily along with Flonase nasal spray 1 to 2 spray  in each nostril daily consistently over the next week May use over-the-counter Delsym or Robitussin for cough Rest and drink plenty of fluids Zofran for nausea Tizanidine as needed for muscle spasming  Please follow-up if any symptoms not improving or worsening   ED Prescriptions    Medication Sig Dispense Auth. Provider   cetirizine-pseudoephedrine (ZYRTEC-D) 5-120 MG tablet Take 1 tablet by mouth daily. 20 tablet Anisten Tomassi C, PA-C   fluticasone (FLONASE) 50 MCG/ACT nasal spray Place 1-2 sprays into both nostrils daily for 7 days. 1 g Chosen Garron C, PA-C   ondansetron (ZOFRAN ODT) 4 MG disintegrating tablet Take 1 tablet (4 mg total) by mouth every 8 (eight) hours as needed for nausea or vomiting. 20 tablet Ambrose Wile C, PA-C   tiZANidine (ZANAFLEX) 4 MG tablet Take 1 tablet (4 mg total) by mouth every 6 (six) hours as needed for muscle spasms. 30 tablet Kendarious Gudino, [place] C, PA-C     PDMP not reviewed this encounter.   Janith Lima, PA-C 05/03/20 1158

## 2020-05-04 LAB — POC URINE PREG, ED: Preg Test, Ur: NEGATIVE

## 2020-06-04 ENCOUNTER — Emergency Department (HOSPITAL_COMMUNITY)
Admission: EM | Admit: 2020-06-04 | Discharge: 2020-06-05 | Disposition: A | Discharge status: Home / Self Care | Payer: Medicaid Other | Attending: Emergency Medicine | Admitting: Emergency Medicine

## 2020-06-04 DIAGNOSIS — R197 Diarrhea, unspecified: Secondary | ICD-10-CM | POA: Diagnosis present

## 2020-06-04 DIAGNOSIS — Z5321 Procedure and treatment not carried out due to patient leaving prior to being seen by health care provider: Secondary | ICD-10-CM | POA: Diagnosis not present

## 2020-06-05 ENCOUNTER — Encounter (HOSPITAL_COMMUNITY): Payer: Self-pay

## 2020-06-05 ENCOUNTER — Other Ambulatory Visit: Payer: Self-pay

## 2020-06-05 ENCOUNTER — Encounter (HOSPITAL_COMMUNITY): Payer: Self-pay | Admitting: Emergency Medicine

## 2020-06-05 ENCOUNTER — Emergency Department (HOSPITAL_COMMUNITY)
Admission: EM | Admit: 2020-06-05 | Discharge: 2020-06-05 | Disposition: A | Discharge status: Home / Self Care | Payer: Medicaid Other | Attending: Emergency Medicine | Admitting: Emergency Medicine

## 2020-06-05 DIAGNOSIS — Z5321 Procedure and treatment not carried out due to patient leaving prior to being seen by health care provider: Secondary | ICD-10-CM | POA: Diagnosis not present

## 2020-06-05 DIAGNOSIS — R109 Unspecified abdominal pain: Secondary | ICD-10-CM | POA: Diagnosis not present

## 2020-06-05 NOTE — ED Triage Notes (Signed)
Pt. A&O x4 ambulatory. Having severe pain for past month " feels like stomach is going to fall out of her". Patient reports pain increase after having sex and is having diarrhea since yesterday.

## 2020-06-05 NOTE — ED Notes (Signed)
Eloped from waiting area. Called 3X.  

## 2020-06-05 NOTE — ED Notes (Signed)
Pt tells this Clinical research associate several times while obtaining v/s that it "feels like my stomach is going to fall out my a**"

## 2020-06-05 NOTE — ED Notes (Signed)
Per screener, the patient stated she was leaving and then walked out the doors.

## 2020-06-05 NOTE — ED Triage Notes (Signed)
Patient c/o abdominal pain x 3 days. Patient was in the ED yesterday for the same and left AMA due to wait times.

## 2020-07-16 LAB — OB RESULTS CONSOLE RUBELLA ANTIBODY, IGM: Rubella: IMMUNE

## 2020-07-16 LAB — OB RESULTS CONSOLE HIV ANTIBODY (ROUTINE TESTING): HIV: NONREACTIVE

## 2020-07-16 LAB — OB RESULTS CONSOLE RPR: RPR: NONREACTIVE

## 2020-07-16 LAB — OB RESULTS CONSOLE HEPATITIS B SURFACE ANTIGEN: Hepatitis B Surface Ag: NEGATIVE

## 2020-07-22 LAB — OB RESULTS CONSOLE GC/CHLAMYDIA
Chlamydia: NEGATIVE
Gonorrhea: NEGATIVE

## 2020-09-22 ENCOUNTER — Encounter (HOSPITAL_COMMUNITY): Payer: Self-pay | Admitting: Emergency Medicine

## 2020-09-22 ENCOUNTER — Ambulatory Visit (HOSPITAL_COMMUNITY)
Admission: EM | Admit: 2020-09-22 | Discharge: 2020-09-22 | Disposition: A | Discharge status: Home / Self Care | Payer: Medicaid Other | Attending: Family Medicine | Admitting: Family Medicine

## 2020-09-22 ENCOUNTER — Ambulatory Visit (INDEPENDENT_AMBULATORY_CARE_PROVIDER_SITE_OTHER): Payer: Medicaid Other

## 2020-09-22 ENCOUNTER — Other Ambulatory Visit: Payer: Self-pay

## 2020-09-22 DIAGNOSIS — M25562 Pain in left knee: Secondary | ICD-10-CM

## 2020-09-22 NOTE — ED Triage Notes (Signed)
Pt presents with left knee pain after falling down 4-5 steps earlier today.

## 2020-09-23 NOTE — ED Provider Notes (Signed)
[facility]   [id number] 09/22/20 Arrival Time: 1558  ASSESSMENT & PLAN:  1. Acute pain of left knee     I have personally viewed the imaging studies ordered this visit. No fractures appreciated.  OTC Tylenol as needed.  Orders Placed This Encounter  Procedures   DG Knee Complete 4 Views Left    Recommend:  Follow-up Information    [facility].   Why: If worsening or failing to improve as anticipated. Contact information: [address] [place] [address] [phone]              Reviewed expectations re: course of current medical issues. Questions answered. Outlined signs and symptoms indicating need for more acute intervention. Patient verbalized understanding. After Visit Summary given.  SUBJECTIVE: History from: patient. Annette Archer is a 19 y.o. female who reports fairly persistent mild to moderate pain of her left knee; described as aching; without radiation. Onset: abrupt. First noted: today. Injury/trama: reports slipping on stairs; questions twisting vs hitting knee on wall. Symptoms have progressed to a point and plateaued since beginning. Aggravating factors: certain movements. Alleviating factors: have not been identified. Associated symptoms: none reported. Extremity sensation changes or weakness: none. Self treatment: has not tried OTC therapies.  History of similar: no.  History reviewed. No pertinent surgical history.   Is pregnant; approx 21 weeks.  OBJECTIVE:  Vitals:   09/22/20 1747  BP: (!) 112/58  Pulse: 75  Resp: 17  Temp: 98.9 F (37.2 C)  TempSrc: Oral  SpO2: 100%    General appearance: alert; no distress HEENT: [place]; AT Neck: supple with FROM Resp: unlabored respirations Extremities:  LLE: warm with well perfused appearance; poorly localized mild tenderness over left knee; without gross deformities; swelling: none; bruising: none; knee ROM: normal with  reported discomfort Skin: warm and dry; no visible rashes Neurologic: gait normal; normal sensation and strength of LLE Psychological: alert and cooperative; normal mood and affect  Imaging: DG Knee Complete 4 Views Left  Result Date: 09/22/2020 CLINICAL DATA:  Pain after a fall. EXAM: LEFT KNEE - COMPLETE 4+ VIEW COMPARISON:  01/09/2017 FINDINGS: No acute fracture or dislocation.  No joint effusion. IMPRESSION: No acute osseous abnormality. Electronically Signed   By: Jeronimo Greaves M.D.   On: 09/22/2020 19:10      Allergies  Allergen Reactions   Caffeine Other (See Comments)    insomnia   Red Dye     Can not take because of adderall   Yellow Dyes (Non-Tartrazine) Other (See Comments)    Per pts mother she can not have because she takes adderall    Past Medical History:  Diagnosis Date   ADHD (attention deficit hyperactivity disorder)    Depression    ODD (oppositional defiant disorder)    Social History   Socioeconomic History   Marital status: Single    Spouse name: Not on file   Number of children: Not on file   Years of education: Not on file   Highest education level: Not on file  Occupational History   Not on file  Tobacco Use   Smoking status: Never Smoker   Smokeless tobacco: Never Used  Substance and Sexual Activity   Alcohol use: Yes   Drug use: Not Currently    Types: Marijuana   Sexual activity: Never    Birth control/protection: None  Other Topics Concern   Not on file  Social History Narrative   Not on file  Social Determinants of Health   Financial Resource Strain:    Difficulty of Paying Living Expenses: Not on file  Food Insecurity:    Worried About Programme researcher, broadcasting/film/video in the Last Year: Not on file   [employer] of Food in the Last Year: Not on file  Transportation Needs:    Lack of Transportation (Medical): Not on file   Lack of Transportation (Non-Medical): Not on file  Physical Activity:    Days of Exercise per  Week: Not on file   Minutes of Exercise per Session: Not on file  Stress:    Feeling of Stress : Not on file  Social Connections:    Frequency of Communication with Friends and Family: Not on file   Frequency of Social Gatherings with Friends and Family: Not on file   Attends Religious Services: Not on file   Active Member of Clubs or Organizations: Not on file   Attends Banker Meetings: Not on file   Marital Status: Not on file   Family History  Problem Relation Age of Onset   Healthy Mother    Healthy Father    History reviewed. No pertinent surgical history.    Mardella Layman, MD 09/23/20 [phone]

## 2020-11-13 ENCOUNTER — Other Ambulatory Visit: Payer: Self-pay

## 2020-11-13 ENCOUNTER — Inpatient Hospital Stay (HOSPITAL_COMMUNITY): Payer: Medicaid Other

## 2020-11-13 ENCOUNTER — Inpatient Hospital Stay (HOSPITAL_COMMUNITY)
Admission: AD | Admit: 2020-11-13 | Discharge: 2020-11-13 | Disposition: A | Discharge status: Home / Self Care | Payer: Medicaid Other | Attending: Obstetrics & Gynecology | Admitting: Obstetrics & Gynecology

## 2020-11-13 ENCOUNTER — Encounter (HOSPITAL_COMMUNITY): Payer: Self-pay | Admitting: Obstetrics & Gynecology

## 2020-11-13 DIAGNOSIS — Z3A28 28 weeks gestation of pregnancy: Secondary | ICD-10-CM | POA: Insufficient documentation

## 2020-11-13 DIAGNOSIS — U071 COVID-19: Secondary | ICD-10-CM | POA: Diagnosis not present

## 2020-11-13 DIAGNOSIS — R509 Fever, unspecified: Secondary | ICD-10-CM | POA: Diagnosis present

## 2020-11-13 DIAGNOSIS — O98513 Other viral diseases complicating pregnancy, third trimester: Secondary | ICD-10-CM | POA: Diagnosis not present

## 2020-11-13 HISTORY — DX: Anemia, unspecified: D64.9

## 2020-11-13 LAB — COMPREHENSIVE METABOLIC PANEL
ALT: 18 U/L (ref 0–44)
AST: 24 U/L (ref 15–41)
Albumin: 2.5 g/dL — ABNORMAL LOW (ref 3.5–5.0)
Alkaline Phosphatase: 62 U/L (ref 38–126)
Anion gap: 9 (ref 5–15)
BUN: <5 mg/dL — ABNORMAL LOW (ref 6–20)
CO2: 20 mmol/L — ABNORMAL LOW (ref 22–32)
Calcium: 8.3 mg/dL — ABNORMAL LOW (ref 8.9–10.3)
Chloride: 100 mmol/L (ref 98–111)
Creatinine, Ser: 0.6 mg/dL (ref 0.44–1.00)
GFR, Estimated: >60 mL/min (ref >60)
Glucose, Bld: 98 mg/dL (ref 70–99)
Potassium: 3.3 mmol/L — ABNORMAL LOW (ref 3.5–5.1)
Sodium: 129 mmol/L — ABNORMAL LOW (ref 135–145)
Total Bilirubin: 0.4 mg/dL (ref 0.3–1.2)
Total Protein: 5.6 g/dL — ABNORMAL LOW (ref 6.5–8.1)

## 2020-11-13 LAB — RESP PANEL BY RT-PCR (FLU A&B, COVID) ARPGX2
Influenza A by PCR: NEGATIVE
Influenza B by PCR: NEGATIVE
SARS Coronavirus 2 by RT PCR: POSITIVE — AB

## 2020-11-13 LAB — CBC
HCT: 27.8 % — ABNORMAL LOW (ref 36.0–46.0)
Hemoglobin: 8.9 g/dL — ABNORMAL LOW (ref 12.0–15.0)
MCH: 25.6 pg — ABNORMAL LOW (ref 26.0–34.0)
MCHC: 32 g/dL (ref 30.0–36.0)
MCV: 79.9 fL — ABNORMAL LOW (ref 80.0–100.0)
Platelets: 218 10*3/uL (ref 150–400)
RBC: 3.48 MIL/uL — ABNORMAL LOW (ref 3.87–5.11)
RDW: 14 % (ref 11.5–15.5)
WBC: 7.5 10*3/uL (ref 4.0–10.5)
nRBC: 0 % (ref 0.0–0.2)

## 2020-11-13 LAB — BRAIN NATRIURETIC PEPTIDE: B Natriuretic Peptide: 98.3 pg/mL (ref 0.0–100.0)

## 2020-11-13 LAB — TROPONIN I (HIGH SENSITIVITY): Troponin I (High Sensitivity): 4 ng/L (ref <18)

## 2020-11-13 MED ORDER — ACETAMINOPHEN 325 MG PO TABS
650.0000 mg | ORAL_TABLET | Freq: Once | ORAL | Status: AC
  Administered 2020-11-13: 650 mg via ORAL
  Filled 2020-11-13: qty 2

## 2020-11-13 NOTE — Discharge Instructions (Signed)
10 Things You Can Do to Manage Your COVID-19 Symptoms at Home If you have possible or confirmed COVID-19: 1. Stay home from work and school. And stay away from other public places. If you must go out, avoid using any kind of public transportation, ridesharing, or taxis. 2. Monitor your symptoms carefully. If your symptoms get worse, call your healthcare provider immediately. 3. Get rest and stay hydrated. 4. If you have a medical appointment, call the healthcare provider ahead of time and tell them that you have or may have COVID-19. 5. For medical emergencies, call 911 and notify the dispatch personnel that you have or may have COVID-19. 6. Cover your cough and sneezes with a tissue or use the inside of your elbow. 7. Wash your hands often with soap and water for at least 20 seconds or clean your hands with an alcohol-based hand sanitizer that contains at least 60% alcohol. 8. As much as possible, stay in a specific room and away from other people in your home. Also, you should use a separate bathroom, if available. If you need to be around other people in or outside of the home, wear a mask. 9. Avoid sharing personal items with other people in your household, like dishes, towels, and bedding. 10. Clean all surfaces that are touched often, like counters, tabletops, and doorknobs. Use household cleaning sprays or wipes according to the label instructions. cdc.gov/coronavirus 05/08/2019 This information is not intended to replace advice given to you by your health care provider. Make sure you discuss any questions you have with your health care provider. Document Revised: 10/10/2019 Document Reviewed: 10/10/2019 Elsevier Patient Education  2020 Elsevier Inc.  

## 2020-11-13 NOTE — MAU Provider Note (Addendum)
Patient Annette Archer is a [name]  at [redacted]w[redacted]d here with complaints of fever last night and body aches that started last night. She denies contractions, vaginal bleeding, decreased fetal movements. She reports chest tightness but no difficulty breathing. She denies SOB.   History     CSN: [id number]  Arrival date and time: 11/13/20 7106   Event Date/Time   First Provider Initiated Contact with Patient 11/13/20 1141      Chief Complaint  Patient presents with  . Fever  . Generalized Body Aches   HPI Patient reports that she had a fever last night and that she overall feels like her body is aching. She reports that it is hard to take a full breath but she does not know if her large breasts could be weighing down her chest. She denies any contacts with sick people. She is not vaccinated against COVID-19. She denies dysuria, abnormal discharge, vomiting, diarrhea. Nothing makes it better or worse.  OB History    Gravida  1   Para      Term      Preterm      AB      Living        SAB      IAB      Ectopic      Multiple      Live Births              Past Medical History:  Diagnosis Date  . ADHD (attention deficit hyperactivity disorder)   . Anemia   . Depression   . ODD (oppositional defiant disorder)     Past Surgical History:  Procedure Laterality Date  . NO PAST SURGERIES      Family History  Problem Relation Age of Onset  . Healthy Mother   . Healthy Father     Social History   Tobacco Use  . Smoking status: Never Smoker  . Smokeless tobacco: Never Used  Substance Use Topics  . Alcohol use: Not Currently  . Drug use: Not Currently    Types: Marijuana    Allergies:  Allergies  Allergen Reactions  . Caffeine Other (See Comments)    insomnia  . Red Dye     Can not take because of adderall  . Yellow Dyes (Non-Tartrazine) Other (See Comments)    Per pts mother she can not have because she takes adderall    No medications prior to  admission.    Review of Systems  Constitutional: Negative.   HENT: Negative.   Respiratory: Negative.   Cardiovascular: Negative.   Gastrointestinal: Negative.   Genitourinary: Negative.   Musculoskeletal: Positive for myalgias.  Neurological: Negative.   Psychiatric/Behavioral: Negative.    Physical Exam   Blood pressure 121/60, pulse (!) 121, temperature 99.5 F (37.5 C), resp. rate 18, last menstrual period 03/04/2020, SpO2 98 %.  Physical Exam Cardiovascular:     Rate and Rhythm: Normal rate.  Pulmonary:     Effort: Pulmonary effort is normal. No respiratory distress.     Breath sounds: No stridor. No wheezing or rhonchi.  Abdominal:     General: Abdomen is flat.  Musculoskeletal:        General: Normal range of motion.  Skin:    General: Skin is warm.  Neurological:     Mental Status: She is alert.   Abdomen is soft, non-tender. No CVA tenderness.   MAU Course  Procedures  MDM -Tylenol: slight improvement -UA: pending -CBC normal, no  white count, CMP normal -NST: 160, mod var, present acel, no decels, no contractions. -EKG normal  -Chest x-ray is normal, no signs of pneumonia No index of suspicion for pyelenephritis or pneumonia.   Patient in no distress while in MAU; ambulating in room with no diffiuclty and no respiratory distress.   Patient Vitals for the past 24 hrs:  BP Temp Pulse Resp SpO2  11/13/20 1010 121/60 99.5 F (37.5 C) (!) 121 18 98 %    Assessment and Plan   1. COVID-19 affecting pregnancy in third trimester    2. Patient stable for discharge with work note to complete full quarantine of 10 days. Quarantine/isolation precautions given, and emphasis on preventing spread to close contacts and family members.   3. Reviewed strict return precautions, including breathing precautions, decreased fetal movements,  and when to return to MAU.   Charlesetta Garibaldi Anaid Haney 11/13/2020, 2:41 PM

## 2020-11-13 NOTE — MAU Note (Signed)
Pt reports she started having body ache and fever of 100 and this morning 101.6. took tylenol last night. Stated her chest is tight and hard to take a deep breath in.  Good fetal movement felt. Denies any ctx. No sore throat or cough reported.

## 2021-01-04 LAB — OB RESULTS CONSOLE GBS: GBS: NEGATIVE

## 2021-02-02 ENCOUNTER — Other Ambulatory Visit: Payer: Self-pay | Admitting: Obstetrics and Gynecology

## 2021-02-03 ENCOUNTER — Telehealth (HOSPITAL_COMMUNITY): Payer: Self-pay | Admitting: *Deleted

## 2021-02-03 ENCOUNTER — Encounter (HOSPITAL_COMMUNITY): Payer: Self-pay | Admitting: *Deleted

## 2021-02-03 NOTE — Telephone Encounter (Signed)
Preadmission screen  

## 2021-02-04 ENCOUNTER — Other Ambulatory Visit (HOSPITAL_COMMUNITY): Payer: Medicaid Other

## 2021-02-05 ENCOUNTER — Other Ambulatory Visit: Payer: Self-pay

## 2021-02-05 NOTE — H&P (Signed)
Annette Archer is a 20 y.o. female, G1P0000, IUP at 41 weeks, presenting for IOL for late term. GBS-. LR female. EFW via [place] 2/28 7.4lbs 87%, posterior placenta, AFI 20.5. Pt had x1 elevated BP 130/80s in office 3/17, baseline labs drawn for PreE they were unremarkable, pt had elevated BP here 143/92, 152/94, dx criteria for GHTN not met yet, no meds indicated, asymptomatic, labs orderedPt endorse + Fm. Denies vaginal leakage. Denies vaginal bleeding. Denies feeling cxt's.   Pregnancy Problems anemia of pregnancy (Hgb 9.6 at 28 weeks, rx Fe. Admitting hgb of 7.8, asymptomatic) attention deficit hyperactivity disorder (No meds) genital herpes simplex (valtrex at 36 weeks, no s/sx or prodromal s.sx, no active lesions)  oppositional defiant disorder (Noted in Epic)  SARS-CoV-2 (dx on 11/13/20 in [facility])  vitamin D deficiency )18.4 at 28 weeks, recommended supplemenation, recheck pp.)  Patient Active Problem List   Diagnosis Date Noted  . Encounter for induction of labor 02/06/2021  . ODD (oppositional defiant disorder)      Medications Prior to Admission  Medication Sig Dispense Refill Last Dose  . cetirizine-pseudoephedrine (ZYRTEC-D) 5-120 MG tablet Take 1 tablet by mouth daily. 20 tablet 0 02/05/2021 at Unknown time  . ondansetron (ZOFRAN ODT) 4 MG disintegrating tablet Take 1 tablet (4 mg total) by mouth every 8 (eight) hours as needed for nausea or vomiting. 20 tablet 0 Past Week at Unknown time  . valACYclovir (VALTREX) 1000 MG tablet Take 1 tablet daily for 5 days at the start of an outbreak. 30 tablet 5 02/05/2021 at Unknown time  . fluticasone (FLONASE) 50 MCG/ACT nasal spray Place 1-2 sprays into both nostrils daily for 7 days. 1 g 0   . Prenat w/o A-FeCbGl-DSS-FA-DHA (CITRANATAL 90 DHA) 90-1 & 300 MG MISC Take 1 tablet by mouth daily.   More than a month at Unknown time    Past Medical History:  Diagnosis Date  . ADHD (attention deficit hyperactivity disorder)   . Anemia   . Depression    . ODD (oppositional defiant disorder)      No current facility-administered medications on file prior to encounter.   Current Outpatient Medications on File Prior to Encounter  Medication Sig Dispense Refill  . cetirizine-pseudoephedrine (ZYRTEC-D) 5-120 MG tablet Take 1 tablet by mouth daily. 20 tablet 0  . ondansetron (ZOFRAN ODT) 4 MG disintegrating tablet Take 1 tablet (4 mg total) by mouth every 8 (eight) hours as needed for nausea or vomiting. 20 tablet 0  . valACYclovir (VALTREX) 1000 MG tablet Take 1 tablet daily for 5 days at the start of an outbreak. 30 tablet 5  . fluticasone (FLONASE) 50 MCG/ACT nasal spray Place 1-2 sprays into both nostrils daily for 7 days. 1 g 0  . Prenat w/o A-FeCbGl-DSS-FA-DHA (CITRANATAL 90 DHA) 90-1 & 300 MG MISC Take 1 tablet by mouth daily.       Allergies  Allergen Reactions  . Caffeine Other (See Comments)    insomnia  . Red Dye     Can not take because of adderall  . Yellow Dyes (Non-Tartrazine) Other (See Comments)    Per pts mother she can not have because she takes adderall    History of present pregnancy: Pt Info/Preference:  Screening/Consents:  Labs:   EDD: Estimated Date of Delivery: 01/30/21  Establised: Patient's last menstrual period was 03/04/2020 (approximate).  Anatomy Scan: Date: 11/8 Placenta Location: posterior , WNL Genetic Screen: Panoroma:LR female AFP: Neg First Tri: Quad:  Office: [facility]  First PNV: 11.5 wg Blood Type  A+  Language: english Last PNV: 40.5 wg Rhogam    Flu Vaccine:  utd   Antibody  Neg  TDaP vaccine utd   GTT: Early: N/A Third Trimester: 122  Feeding Plan: Breast BTL: no Rubella:  Immune  Contraception: ??? VBAC: no RPR:   NR  Circumcision: N/A   HBsAg:  Neg  Pediatrician:  ???   HIV:   Neg  Prenatal Classes: no Additional [place]:  EFW via [place] 2/28 7.4lbs 87%, posterior placenta, AFI 20.5 GBS:  Negative(For PCN allergy, check sensitivities)       Chlamydia: Neg    MFM Referral/Consult:  GC:  neg  Support Person: partner   PAP: ???  Pain Management: epidural Neonatologist Referral:  Hgb Electrophoresis:  AA  Birth Plan: DCC   Hgb NOB: 12.2    28W: 9.6   OB History    Gravida  1   Para      Term      Preterm      AB      Living        SAB      IAB      Ectopic      Multiple      Live Births             Past Medical History:  Diagnosis Date  . ADHD (attention deficit hyperactivity disorder)   . Anemia   . Depression   . ODD (oppositional defiant disorder)    Past Surgical History:  Procedure Laterality Date  . NO PAST SURGERIES     Family History: family history includes Healthy in her father and mother. Social History:  reports that she has never smoked. She has never used smokeless tobacco. She reports previous alcohol use. She reports previous drug use. Drug: Marijuana.   Prenatal Transfer Tool  Maternal Diabetes: No Genetic Screening: Normal Maternal Ultrasounds/Referrals: Normal Fetal Ultrasounds or other Referrals:  None Maternal Substance Abuse:  No Significant Maternal Medications:  Meds include: Other: Valtrex Significant Maternal Lab Results: Group B Strep negative  ROS:  Review of Systems  Constitutional: Negative.   HENT: Negative.   Eyes: Negative.   Respiratory: Negative.   Cardiovascular: Negative.   Gastrointestinal: Negative.   Genitourinary: Negative.   Musculoskeletal: Negative.   Skin: Negative.   Neurological: Negative.   Endo/Heme/Allergies: Negative.   Psychiatric/Behavioral: Negative.      Physical Exam: BP (!) 143/92   Pulse 77   Temp 98.8 F (37.1 C) (Oral)   Resp 18   Ht 5' 5"  (1.651 m)   Wt 102.3 kg   LMP 03/04/2020 (Approximate)   BMI 37.54 kg/m   Physical Exam Vitals and nursing note reviewed. Exam conducted with a chaperone present.  Constitutional:      Appearance: Normal appearance.  HENT:     Head: Normocephalic and atraumatic.     Nose: Nose normal.     Mouth/Throat:     Pharynx:  Oropharynx is clear.  Eyes:     Conjunctiva/sclera: Conjunctivae normal.  Cardiovascular:     Rate and Rhythm: Normal rate and regular rhythm.     Pulses: Normal pulses.     Heart sounds: Normal heart sounds.  Pulmonary:     Effort: Pulmonary effort is normal.     Breath sounds: Normal breath sounds.  Abdominal:     General: Bowel sounds are normal.  Genitourinary:    General: Normal vulva.     Rectum:  Normal.     Comments: Uterus gravida equal to dates, pelvis adequate for vaginal delivery, no lesions noted Musculoskeletal:        General: Normal range of motion.     Cervical back: Normal range of motion and neck supple.  Skin:    General: Skin is warm.     Capillary Refill: Capillary refill takes less than 2 seconds.  Neurological:     General: No focal deficit present.     Mental Status: She is alert.  Psychiatric:        Mood and Affect: Mood normal.      NST: FHR baseline 145 bpm, Variability: moderate, Accelerations:present, Decelerations:  Absent= Cat 1/Reactive UC:   Occ SVE:  0/50/-3 Dilation: Fingertip Effacement (%): Thick Station: -3 Exam by:: Petra Kuba, RN, vertex verified by fetal sutures.  Leopold's: Position vertex, EFW 8lbs via leopold's.   Labs: Results for orders placed or performed during the hospital encounter of 02/06/21 (from the past 24 hour(s))  CBC     Status: Abnormal   Collection Time: 02/06/21 12:39 AM  Result Value Ref Range   WBC 8.3 4.0 - 10.5 K/uL   RBC 3.64 (L) 3.87 - 5.11 MIL/uL   Hemoglobin 7.8 (L) 12.0 - 15.0 g/dL   HCT 26.1 (L) 36.0 - 46.0 %   MCV 71.7 (L) 80.0 - 100.0 fL   MCH 21.4 (L) 26.0 - 34.0 pg   MCHC 29.9 (L) 30.0 - 36.0 g/dL   RDW 19.2 (H) 11.5 - 15.5 %   Platelets 280 150 - 400 K/uL   nRBC 0.4 (H) 0.0 - 0.2 %    Imaging:  No results found.  MAU Course: Orders Placed This Encounter  Procedures  . CBC  . RPR  . Diet clear liquid Room service appropriate? Yes; Fluid consistency: Thin  . Vitals signs per  unit policy  . Notify Physician  . Fetal monitoring per unit policy  . Activity as tolerated  . Cervical Exam  . Measure blood pressure post delivery every 15 min x 1 hour then every 30 min x 1 hour  . Fundal check post delivery every 15 min x 1 hour then every 30 min x 1 hour  . If Rapid HIV test positive or known HIV positive: initiate AZT orders  . May in and out cath x 2 for inability to void  . Discontinue foley prior to vaginal delivery  . Initiate Carrier Fluid Protocol  . Initiate Oral Care Protocol  . Order Rapid HIV per protocol if no results on chart  . Patient may have epidural placement upon request  . Evaluate fetal heart rate to establish reassuring pattern prior to initiating Cytotec or Pitocin  . Perform a cervical exam prior to initiating Cytotec or Pitocin  . Discontinue Pitocin if tachysystole with non-reassuring FHR is present  . Nofify MD/CNM if tachysystole with non-reassuring FHR is present  . Initiate intrauterine resuscitation if tachysystole with non-reasuring FHR is present  . If tachysystole WITH reassuring FHR present notify MD / CNM  . May administer Terbutaline 0.25 mg SQ x 1 dose if tachysystole with non-reassuring FHR is presesnt  . Labor Induction  . Full code  . Type and screen Kevin  . Insert and maintain IV Line  . Admit to Inpatient (patient's expected length of stay will be greater than 2 midnights or inpatient only procedure)   Meds ordered this encounter  Medications  . lactated ringers infusion  .  oxytocin (PITOCIN) IV BOLUS FROM BAG  . oxytocin (PITOCIN) IV infusion 30 units in NS 500 mL - Premix  . lactated ringers infusion 500-1,000 mL  . acetaminophen (TYLENOL) tablet 650 mg  . oxyCODONE-acetaminophen (PERCOCET/ROXICET) 5-325 MG per tablet 1 tablet  . oxyCODONE-acetaminophen (PERCOCET/ROXICET) 5-325 MG per tablet 2 tablet  . ondansetron (ZOFRAN) injection 4 mg  . sodium citrate-citric acid (ORACIT) solution 30  mL  . lidocaine (PF) (XYLOCAINE) 1 % injection 30 mL  . fentaNYL (SUBLIMAZE) injection 50-100 mcg  . terbutaline (BRETHINE) injection 0.25 mg  . misoprostol (CYTOTEC) tablet 25 mcg    Assessment/Plan: Inayah Woodin is a 20 y.o. female, G1P0000, IUP at 41 weeks, presenting for IOL for late term. GBS-. LR female. EFW via [place] 2/28 7.4lbs 87%, posterior placenta, AFI 20.5. Pt had x1 elevated BP 130/80s in office 3/17, baseline labs drawn for PreE they were unremarkable, pt had elevated BP here 143/92, 152/94, dx criteria for GHTN not met yet, no meds indicated, asymptomatic, labs ordered. Pt endorse + Fm. Denies vaginal leakage. Denies vaginal bleeding. Denies feeling cxt's.   Pregnancy Problems anemia of pregnancy (Hgb 9.6 at 28 weeks, rx Fe.  Admitting hgb of 7.8, asymptomatic) attention deficit hyperactivity disorder (No meds) genital herpes simplex (valtrex at 36 weeks, no s/sx or prodromal s.sx, no active lesions)  oppositional defiant disorder (Noted in Epic)  SARS-CoV-2 (dx on 11/13/20 in [facility])  vitamin D deficiency )18.4 at 28 weeks, recommended supplemenation, recheck pp.)  FWB: Cat 1 Fetal Tracing.   Plan: Admit to [place] per consult with Dr Delora Fuel Elevated BP: Pending CMP, Urine PCR.  Anemia: May have 2 unit RBC on hole for delivery if indicated, possible RBC verus IV iron transfusion PP.  Routine [facility] orders Pain med/epidural prn Cytotec for cervical ripening.  Anticipate labor progression   Noralyn Pick NP-C, CNM, MSN 02/06/2021, 2:03 AM

## 2021-02-06 ENCOUNTER — Inpatient Hospital Stay (HOSPITAL_COMMUNITY): Payer: Medicaid Other | Admitting: Anesthesiology

## 2021-02-06 ENCOUNTER — Inpatient Hospital Stay (HOSPITAL_COMMUNITY)
Admission: AD | Admit: 2021-02-06 | Discharge: 2021-02-09 | DRG: 786 | Disposition: A | Discharge status: Home / Self Care | Payer: Medicaid Other | Attending: Obstetrics & Gynecology | Admitting: Obstetrics & Gynecology

## 2021-02-06 ENCOUNTER — Encounter (HOSPITAL_COMMUNITY): Payer: Self-pay | Admitting: Anesthesiology

## 2021-02-06 ENCOUNTER — Encounter (HOSPITAL_COMMUNITY): Payer: Self-pay | Admitting: Obstetrics and Gynecology

## 2021-02-06 ENCOUNTER — Inpatient Hospital Stay (HOSPITAL_COMMUNITY): Payer: Medicaid Other

## 2021-02-06 DIAGNOSIS — Z3A41 41 weeks gestation of pregnancy: Secondary | ICD-10-CM

## 2021-02-06 DIAGNOSIS — O9902 Anemia complicating childbirth: Secondary | ICD-10-CM | POA: Diagnosis present

## 2021-02-06 DIAGNOSIS — O134 Gestational [pregnancy-induced] hypertension without significant proteinuria, complicating childbirth: Secondary | ICD-10-CM | POA: Diagnosis present

## 2021-02-06 DIAGNOSIS — O480 Post-term pregnancy: Principal | ICD-10-CM | POA: Diagnosis present

## 2021-02-06 DIAGNOSIS — D509 Iron deficiency anemia, unspecified: Secondary | ICD-10-CM | POA: Diagnosis present

## 2021-02-06 DIAGNOSIS — O411290 Chorioamnionitis, unspecified trimester, not applicable or unspecified: Secondary | ICD-10-CM | POA: Diagnosis not present

## 2021-02-06 DIAGNOSIS — Z8616 Personal history of COVID-19: Secondary | ICD-10-CM

## 2021-02-06 DIAGNOSIS — Z3490 Encounter for supervision of normal pregnancy, unspecified, unspecified trimester: Secondary | ICD-10-CM | POA: Diagnosis present

## 2021-02-06 DIAGNOSIS — O9832 Other infections with a predominantly sexual mode of transmission complicating childbirth: Secondary | ICD-10-CM | POA: Diagnosis present

## 2021-02-06 DIAGNOSIS — O411230 Chorioamnionitis, third trimester, not applicable or unspecified: Secondary | ICD-10-CM | POA: Diagnosis present

## 2021-02-06 DIAGNOSIS — O139 Gestational [pregnancy-induced] hypertension without significant proteinuria, unspecified trimester: Secondary | ICD-10-CM | POA: Diagnosis not present

## 2021-02-06 DIAGNOSIS — A6000 Herpesviral infection of urogenital system, unspecified: Secondary | ICD-10-CM | POA: Diagnosis present

## 2021-02-06 DIAGNOSIS — Z98891 History of uterine scar from previous surgery: Secondary | ICD-10-CM

## 2021-02-06 LAB — COMPREHENSIVE METABOLIC PANEL
ALT: 12 U/L (ref 0–44)
AST: 21 U/L (ref 15–41)
Albumin: 2.4 g/dL — ABNORMAL LOW (ref 3.5–5.0)
Alkaline Phosphatase: 105 U/L (ref 38–126)
Anion gap: 7 (ref 5–15)
BUN: 6 mg/dL (ref 6–20)
CO2: 22 mmol/L (ref 22–32)
Calcium: 8.9 mg/dL (ref 8.9–10.3)
Chloride: 104 mmol/L (ref 98–111)
Creatinine, Ser: 0.69 mg/dL (ref 0.44–1.00)
GFR, Estimated: >60 mL/min (ref >60)
Glucose, Bld: 79 mg/dL (ref 70–99)
Potassium: 3.7 mmol/L (ref 3.5–5.1)
Sodium: 133 mmol/L — ABNORMAL LOW (ref 135–145)
Total Bilirubin: 0.4 mg/dL (ref 0.3–1.2)
Total Protein: 5.5 g/dL — ABNORMAL LOW (ref 6.5–8.1)

## 2021-02-06 LAB — CBC
HCT: 26.1 % — ABNORMAL LOW (ref 36.0–46.0)
Hemoglobin: 7.8 g/dL — ABNORMAL LOW (ref 12.0–15.0)
MCH: 21.4 pg — ABNORMAL LOW (ref 26.0–34.0)
MCHC: 29.9 g/dL — ABNORMAL LOW (ref 30.0–36.0)
MCV: 71.7 fL — ABNORMAL LOW (ref 80.0–100.0)
Platelets: 280 10*3/uL (ref 150–400)
RBC: 3.64 MIL/uL — ABNORMAL LOW (ref 3.87–5.11)
RDW: 19.2 % — ABNORMAL HIGH (ref 11.5–15.5)
WBC: 8.3 10*3/uL (ref 4.0–10.5)
nRBC: 0.4 % — ABNORMAL HIGH (ref 0.0–0.2)

## 2021-02-06 LAB — LACTATE DEHYDROGENASE: LDH: 172 U/L (ref 98–192)

## 2021-02-06 LAB — URIC ACID: Uric Acid, Serum: 5 mg/dL (ref 2.5–7.1)

## 2021-02-06 LAB — RPR: RPR Ser Ql: NONREACTIVE

## 2021-02-06 MED ORDER — LACTATED RINGERS IV SOLN
500.0000 mL | INTRAVENOUS | Status: DC | PRN
Start: 2021-02-06 — End: 2021-02-07
  Administered 2021-02-06 (×3): 500 mL via INTRAVENOUS

## 2021-02-06 MED ORDER — EPHEDRINE 5 MG/ML INJ: 10.0000 mg | INTRAVENOUS | Status: DC | PRN

## 2021-02-06 MED ORDER — LIDOCAINE HCL (PF) 1 % IJ SOLN
INTRAMUSCULAR | Status: DC | PRN
Start: 2021-02-06 — End: 2021-02-07
  Administered 2021-02-06: 4 mL via EPIDURAL
  Administered 2021-02-06: 5 mL via EPIDURAL

## 2021-02-06 MED ORDER — PHENYLEPHRINE 40 MCG/ML (10ML) SYRINGE FOR IV PUSH (FOR BLOOD PRESSURE SUPPORT): 80.0000 ug | PREFILLED_SYRINGE | INTRAVENOUS | Status: DC | PRN

## 2021-02-06 MED ORDER — OXYCODONE-ACETAMINOPHEN 5-325 MG PO TABS
1.0000 | ORAL_TABLET | ORAL | Status: DC | PRN
Start: 2021-02-06 — End: 2021-02-07

## 2021-02-06 MED ORDER — DIPHENHYDRAMINE HCL 50 MG/ML IJ SOLN: 12.5000 mg | INTRAMUSCULAR | Status: DC | PRN

## 2021-02-06 MED ORDER — DIPHENHYDRAMINE HCL 50 MG/ML IJ SOLN
25.0000 mg | Freq: Once | INTRAMUSCULAR | Status: AC
  Administered 2021-02-06: 25 mg via INTRAVENOUS
  Filled 2021-02-06: qty 1

## 2021-02-06 MED ORDER — OXYTOCIN-SODIUM CHLORIDE 30-0.9 UT/500ML-% IV SOLN
1.0000 m[IU]/min | INTRAVENOUS | Status: DC
  Administered 2021-02-06: 1 m[IU]/min via INTRAVENOUS

## 2021-02-06 MED ORDER — OXYTOCIN-SODIUM CHLORIDE 30-0.9 UT/500ML-% IV SOLN
1.0000 m[IU]/min | INTRAVENOUS | Status: DC
  Administered 2021-02-06 (×2): 1 m[IU]/min via INTRAVENOUS

## 2021-02-06 MED ORDER — MISOPROSTOL 25 MCG QUARTER TABLET
25.0000 ug | ORAL_TABLET | ORAL | Status: DC | PRN
  Administered 2021-02-06: 25 ug via VAGINAL
  Filled 2021-02-06: qty 1

## 2021-02-06 MED ORDER — LACTATED RINGERS IV SOLN
500.0000 mL | Freq: Once | INTRAVENOUS | Status: AC
  Administered 2021-02-06: 500 mL via INTRAVENOUS

## 2021-02-06 MED ORDER — OXYTOCIN-SODIUM CHLORIDE 30-0.9 UT/500ML-% IV SOLN
1.0000 m[IU]/min | INTRAVENOUS | Status: DC
Start: 2021-02-06 — End: 2021-02-06

## 2021-02-06 MED ORDER — OXYCODONE-ACETAMINOPHEN 5-325 MG PO TABS: 2.0000 | ORAL_TABLET | ORAL | Status: DC | PRN

## 2021-02-06 MED ORDER — TERBUTALINE SULFATE 1 MG/ML IJ SOLN: 0.2500 mg | Freq: Once | INTRAMUSCULAR | Status: DC | PRN

## 2021-02-06 MED ORDER — SOD CITRATE-CITRIC ACID 500-334 MG/5ML PO SOLN: 30.0000 mL | ORAL | Status: DC | PRN

## 2021-02-06 MED ORDER — ACETAMINOPHEN 325 MG PO TABS: 650.0000 mg | ORAL_TABLET | ORAL | Status: DC | PRN

## 2021-02-06 MED ORDER — TERBUTALINE SULFATE 1 MG/ML IJ SOLN
0.2500 mg | Freq: Once | INTRAMUSCULAR | Status: DC | PRN
  Filled 2021-02-06 (×2): qty 1

## 2021-02-06 MED ORDER — OXYTOCIN BOLUS FROM INFUSION: 333.0000 mL | Freq: Once | INTRAVENOUS | Status: DC

## 2021-02-06 MED ORDER — LIDOCAINE HCL (PF) 1 % IJ SOLN: 30.0000 mL | INTRAMUSCULAR | Status: DC | PRN

## 2021-02-06 MED ORDER — ONDANSETRON HCL 4 MG/2ML IJ SOLN: 4.0000 mg | Freq: Four times a day (QID) | INTRAMUSCULAR | Status: DC | PRN

## 2021-02-06 MED ORDER — LACTATED RINGERS IV SOLN: INTRAVENOUS | Status: DC

## 2021-02-06 MED ORDER — FENTANYL CITRATE (PF) 100 MCG/2ML IJ SOLN
50.0000 ug | INTRAMUSCULAR | Status: DC | PRN
  Administered 2021-02-06: 100 ug via INTRAVENOUS
  Administered 2021-02-06 (×3): 50 ug via INTRAVENOUS
  Administered 2021-02-06: 100 ug via INTRAVENOUS
  Filled 2021-02-06 (×4): qty 2

## 2021-02-06 MED ORDER — OXYTOCIN-SODIUM CHLORIDE 30-0.9 UT/500ML-% IV SOLN
2.5000 [IU]/h | INTRAVENOUS | Status: DC
  Filled 2021-02-06: qty 500

## 2021-02-06 MED ORDER — FENTANYL-BUPIVACAINE-NACL 0.5-0.125-0.9 MG/250ML-% EP SOLN
12.0000 mL/h | EPIDURAL | Status: DC | PRN
Start: 2021-02-06 — End: 2021-02-07
  Administered 2021-02-06: 14 mL/h via EPIDURAL
  Administered 2021-02-07: 12 mL/h via EPIDURAL
  Filled 2021-02-06 (×2): qty 250

## 2021-02-06 NOTE — Anesthesia Procedure Notes (Signed)
Epidural Patient location during procedure: OB Start time: 02/06/2021 10:29 AM End time: 02/06/2021 10:33 AM  Staffing Anesthesiologist: Beryle Lathe, MD Performed: anesthesiologist   Preanesthetic Checklist Completed: patient identified, IV checked, risks and benefits discussed, monitors and equipment checked, pre-op evaluation and timeout performed  Epidural Patient position: sitting Prep: DuraPrep Patient monitoring: continuous pulse ox and blood pressure Approach: midline Location: L3-L4 Injection technique: LOR saline  Needle:  Needle type: Tuohy  Needle gauge: 17 G Needle length: 9 cm Needle insertion depth: 7 cm Catheter size: 19 Gauge Catheter at skin depth: 12 cm Test dose: negative and Other (1% lidocaine)  Assessment Events: blood not aspirated  Additional Notes Patient identified. Risks including, but not limited to, bleeding, infection, nerve damage, paralysis, inadequate analgesia, blood pressure changes, nausea, vomiting, allergic reaction, postpartum back pain, itching, and headache were discussed. Patient expressed understanding and wished to proceed. Sterile prep and drape, including hand hygiene, mask, and sterile gloves were used. The patient was positioned and the spine was prepped. The skin was anesthetized with lidocaine. No paraesthesia or other complication noted. The patient did not experience any signs of intravascular injection such as tinnitus or metallic taste in mouth, nor signs of intrathecal spread such as rapid motor block. Please see nursing notes for vital signs. The patient tolerated the procedure well.   Leslye Peer, MDReason for block:procedure for pain

## 2021-02-06 NOTE — Progress Notes (Signed)
Subjective:    Pt has had Fentanyl x4 doses for latent labor. Discussed pain management options and pt is planning epidural. S/P Cytotec x1 dose, ctx Q 2 min and painful, discussed cervical balloon, and pt agrees. Cervical ballon placed w/o diff and will start low-dose Pitocin for further ripening.  Objective:    VS: BP (!) 135/58   Pulse 87   Temp 98 F (36.7 C) (Oral)   Resp 18   Ht 5\' 5"  (1.651 m)   Wt 102.3 kg   LMP 03/04/2020 (Approximate)   BMI 37.54 kg/m  FHR : baseline 135 / variability moderate / accelerations present / absent decelerations Toco: contractions every 2-4 minutes  Membranes: intact Dilation: 1 Effacement (%): 50 Cervical Position: Middle Station: -2 Presentation: Vertex Exam by:: [email or web address], CNM   Assessment/Plan:   [name] [redacted]w[redacted]d IOL at 44 wks IDA    -plan for Pitocin and TXA after birth to minimize PP bleeding  Labor: S/P Cytotec x1, Cook balloon inserted w/o diff 80 mL uterine and 60 mL vaginal, start Pitocin 1x1 for a max of 6 while balloon is in place Fetal Wellbeing:  Category I Pain Control:  IV pain meds I/D:  GBS neg Anticipated MOD:  NSVD  46 MSN, CNM 02/06/2021 9:18 AM

## 2021-02-06 NOTE — Progress Notes (Signed)
Cervical balloon remain in place. Pt comfortable with epidural, S/O and pt's grandmother at bedside.  Rhea Pink, MSN, CNM 02/06/2021 12:30 PM

## 2021-02-06 NOTE — Progress Notes (Signed)
Subjective:    Comfortable w/ epidural. Cervical edema noted w/ SVE.   Objective:    VS: BP 119/63   Pulse 78   Temp 98.1 F (36.7 C) (Oral)   Resp 17   Ht 5\' 5"  (1.651 m)   Wt 102.3 kg   LMP 03/04/2020 (Approximate)   BMI 37.54 kg/m  FHR : baseline 140 / variability moderate / accelerations present / absent decelerations Toco: contractions every 3-5 minutes  Membranes: AROM x4 hrs, remains clear Dilation: 6 Effacement (%): 60 Cervical Position: Middle Station: -2 Presentation: Vertex Exam by:: [email or web address], CNM Pitocin 2 mU/min  Assessment/Plan:   [name] [redacted]w[redacted]d IOL for dates  Labor: Active labor w/ cervical balloon effacement.  Fetal Wellbeing:  Category I Pain Control:  Epidural I/D:  GBS neg Anticipated MOD:  NSVD  [redacted]w[redacted]d MSN, CNM 02/06/2021 8:19 PM

## 2021-02-06 NOTE — Progress Notes (Signed)
Subjective:    Called to bedside for late decels. SVE performed and IUPC placed. Discussed position changes to facilitate fetal rotation and pt agrees to attempt. Comfortable w/ epidural. Mobile and able to assist herself into hand-knee position safely. CNM and RN remain at bedside while pt on hands and knees and CNM performs spinning babies techniques. Pt tolerated position change for 35 min.   Objective:    VS: BP 105/79   Pulse 79   Temp 98.1 F (36.7 C) (Oral)   Resp 17   Ht 5\' 5"  (1.651 m)   Wt 102.3 kg   LMP 03/04/2020 (Approximate)   SpO2 99%   BMI 37.54 kg/m  FHR : baseline 120 / variability moderate / accelerations absent / absent decelerations IUPC: contractions every 2-3 minutes / MVU 160 Membranes: AROM x8 hrs, remains clear Dilation: (P) 6 Effacement (%): 60 Cervical Position: Middle Station: -2 Presentation: Vertex Exam by:: [email or web address], CNM Pitocin 1 mU/min-restarted  Assessment/Plan:   [name] [redacted]w[redacted]d IOL for dates  Labor: Protracted active phase Fetal Wellbeing:  Category I and Cat II resolved w/ interventions Pain Control:  Epidural I/D:  GBS neg Anticipated MOD:  NSVD   Dr. [redacted]w[redacted]d updated  Connye Burkitt MSN, CNM 02/06/2021 11:49 PM

## 2021-02-06 NOTE — Anesthesia Preprocedure Evaluation (Signed)
Anesthesia Evaluation  Patient identified by MRN, date of birth, ID band Patient awake    Reviewed: Allergy & Precautions, NPO status , Patient's Chart, lab work & pertinent test results  History of Anesthesia Complications Negative for: history of anesthetic complications  Airway Mallampati: II   Neck ROM: Full    Dental   Pulmonary neg pulmonary ROS,    Pulmonary exam normal        Cardiovascular hypertension (gestational), Normal cardiovascular exam     Neuro/Psych PSYCHIATRIC DISORDERS Depression  Oppositional defiant d/o negative neurological ROS     GI/Hepatic negative GI ROS, Neg liver ROS,   Endo/Other   Obesity   Renal/GU negative Renal ROS     Musculoskeletal negative musculoskeletal ROS (+)   Abdominal   Peds  (+) ADHD Hematology  (+) anemia ,  Plt 280k    Anesthesia Other Findings Covid+ 11/2020   Reproductive/Obstetrics (+) Pregnancy                             Anesthesia Physical Anesthesia Plan  ASA: II  Anesthesia Plan: Epidural   Post-op Pain Management:    Induction:   PONV Risk Score and Plan: 2 and Treatment may vary due to age or medical condition  Airway Management Planned: Natural Airway  Additional Equipment: None  Intra-op Plan:   Post-operative Plan:   Informed Consent: I have reviewed the patients History and Physical, chart, labs and discussed the procedure including the risks, benefits and alternatives for the proposed anesthesia with the patient or authorized representative who has indicated his/her understanding and acceptance.       Plan Discussed with: Anesthesiologist  Anesthesia Plan Comments: (Labs reviewed. Platelets acceptable, patient not taking any blood thinning medications. Per RN, FHR tracing reported to be stable enough for sitting procedure. Risks and benefits discussed with patient, including PDPH, backache, epidural  hematoma, failed epidural, blood pressure changes, allergic reaction, and nerve injury. Patient expressed understanding and wished to proceed.)        Anesthesia Quick Evaluation

## 2021-02-07 ENCOUNTER — Encounter (HOSPITAL_COMMUNITY)
Admission: AD | Disposition: A | Discharge status: Home / Self Care | Payer: Self-pay | Source: Home / Self Care | Attending: Obstetrics and Gynecology

## 2021-02-07 ENCOUNTER — Encounter (HOSPITAL_COMMUNITY): Payer: Self-pay | Admitting: Obstetrics and Gynecology

## 2021-02-07 DIAGNOSIS — O411290 Chorioamnionitis, unspecified trimester, not applicable or unspecified: Secondary | ICD-10-CM | POA: Diagnosis not present

## 2021-02-07 LAB — COMPREHENSIVE METABOLIC PANEL
ALT: 13 U/L (ref 0–44)
AST: 24 U/L (ref 15–41)
Albumin: 2.1 g/dL — ABNORMAL LOW (ref 3.5–5.0)
Alkaline Phosphatase: 103 U/L (ref 38–126)
Anion gap: 10 (ref 5–15)
BUN: 6 mg/dL (ref 6–20)
CO2: 20 mmol/L — ABNORMAL LOW (ref 22–32)
Calcium: 8.4 mg/dL — ABNORMAL LOW (ref 8.9–10.3)
Chloride: 101 mmol/L (ref 98–111)
Creatinine, Ser: 1.29 mg/dL — ABNORMAL HIGH (ref 0.44–1.00)
GFR, Estimated: >60 mL/min (ref >60)
Glucose, Bld: 87 mg/dL (ref 70–99)
Potassium: 4.1 mmol/L (ref 3.5–5.1)
Sodium: 131 mmol/L — ABNORMAL LOW (ref 135–145)
Total Bilirubin: 0.7 mg/dL (ref 0.3–1.2)
Total Protein: 5 g/dL — ABNORMAL LOW (ref 6.5–8.1)

## 2021-02-07 LAB — CBC
HCT: 26.1 % — ABNORMAL LOW (ref 36.0–46.0)
Hemoglobin: 7.9 g/dL — ABNORMAL LOW (ref 12.0–15.0)
MCH: 20.9 pg — ABNORMAL LOW (ref 26.0–34.0)
MCHC: 30.3 g/dL (ref 30.0–36.0)
MCV: 69 fL — ABNORMAL LOW (ref 80.0–100.0)
Platelets: 261 10*3/uL (ref 150–400)
RBC: 3.78 MIL/uL — ABNORMAL LOW (ref 3.87–5.11)
RDW: 19.1 % — ABNORMAL HIGH (ref 11.5–15.5)
WBC: 14.2 10*3/uL — ABNORMAL HIGH (ref 4.0–10.5)
nRBC: 0.1 % (ref 0.0–0.2)

## 2021-02-07 LAB — PREPARE RBC (CROSSMATCH)

## 2021-02-07 LAB — ABO/RH: ABO/RH(D): A POS

## 2021-02-07 SURGERY — Surgical Case
Anesthesia: Epidural | Wound class: Clean Contaminated

## 2021-02-07 MED ORDER — SODIUM CHLORIDE 0.9 % IV SOLN
3.0000 g | Freq: Four times a day (QID) | INTRAVENOUS | Status: DC
  Filled 2021-02-07 (×2): qty 8

## 2021-02-07 MED ORDER — LACTATED RINGERS IV SOLN: INTRAVENOUS | Status: DC

## 2021-02-07 MED ORDER — CEFAZOLIN SODIUM-DEXTROSE 2-3 GM-%(50ML) IV SOLR
INTRAVENOUS | Status: DC | PRN
  Administered 2021-02-07: 2 g via INTRAVENOUS

## 2021-02-07 MED ORDER — COCONUT OIL OIL: 1.0000 "application " | TOPICAL_OIL | Status: DC | PRN

## 2021-02-07 MED ORDER — OXYCODONE HCL 5 MG PO TABS: 5.0000 mg | ORAL_TABLET | Freq: Once | ORAL | Status: DC | PRN

## 2021-02-07 MED ORDER — OXYTOCIN-SODIUM CHLORIDE 30-0.9 UT/500ML-% IV SOLN
INTRAVENOUS | Status: AC
  Filled 2021-02-07: qty 500

## 2021-02-07 MED ORDER — NALOXONE HCL 4 MG/10ML IJ SOLN
1.0000 ug/kg/h | INTRAVENOUS | Status: DC | PRN
  Filled 2021-02-07: qty 5

## 2021-02-07 MED ORDER — NALBUPHINE HCL 10 MG/ML IJ SOLN: 5.0000 mg | INTRAMUSCULAR | Status: DC | PRN

## 2021-02-07 MED ORDER — GENTAMICIN SULFATE 40 MG/ML IJ SOLN
5.0000 mg/kg | INTRAVENOUS | Status: AC
  Administered 2021-02-07: 380 mg via INTRAVENOUS
  Filled 2021-02-07: qty 9.5

## 2021-02-07 MED ORDER — DIBUCAINE (PERIANAL) 1 % EX OINT: 1.0000 "application " | TOPICAL_OINTMENT | CUTANEOUS | Status: DC | PRN

## 2021-02-07 MED ORDER — NALBUPHINE HCL 10 MG/ML IJ SOLN: 5.0000 mg | Freq: Once | INTRAMUSCULAR | Status: DC | PRN

## 2021-02-07 MED ORDER — SODIUM CHLORIDE 0.9 % IV SOLN
3.0000 g | Freq: Once | INTRAVENOUS | Status: AC
  Administered 2021-02-07: 3 g via INTRAVENOUS
  Filled 2021-02-07 (×2): qty 8

## 2021-02-07 MED ORDER — FENTANYL CITRATE (PF) 100 MCG/2ML IJ SOLN
INTRAMUSCULAR | Status: DC | PRN
  Administered 2021-02-07 (×2): 50 ug via INTRAVENOUS

## 2021-02-07 MED ORDER — KETOROLAC TROMETHAMINE 30 MG/ML IJ SOLN
INTRAMUSCULAR | Status: AC
  Filled 2021-02-07: qty 1

## 2021-02-07 MED ORDER — TRANEXAMIC ACID-NACL 1000-0.7 MG/100ML-% IV SOLN
INTRAVENOUS | Status: AC
  Filled 2021-02-07: qty 100

## 2021-02-07 MED ORDER — LACTATED RINGERS IV SOLN: INTRAVENOUS | Status: DC | PRN

## 2021-02-07 MED ORDER — ONDANSETRON HCL 4 MG/2ML IJ SOLN
INTRAMUSCULAR | Status: AC
  Filled 2021-02-07: qty 2

## 2021-02-07 MED ORDER — OXYTOCIN-SODIUM CHLORIDE 30-0.9 UT/500ML-% IV SOLN
2.5000 [IU]/h | INTRAVENOUS | Status: AC
  Administered 2021-02-07: 2.5 [IU]/h via INTRAVENOUS
  Filled 2021-02-07: qty 500

## 2021-02-07 MED ORDER — DIPHENHYDRAMINE HCL 50 MG/ML IJ SOLN: 12.5000 mg | INTRAMUSCULAR | Status: DC | PRN

## 2021-02-07 MED ORDER — IBUPROFEN 100 MG PO CHEW: 100.0000 mg | CHEWABLE_TABLET | Freq: Three times a day (TID) | ORAL | Status: DC

## 2021-02-07 MED ORDER — SODIUM CHLORIDE 0.9 % IR SOLN
Status: DC | PRN
  Administered 2021-02-07: 1

## 2021-02-07 MED ORDER — DEXAMETHASONE SODIUM PHOSPHATE 10 MG/ML IJ SOLN
INTRAMUSCULAR | Status: AC
  Filled 2021-02-07: qty 1

## 2021-02-07 MED ORDER — SODIUM BICARBONATE 8.4 % IV SOLN
INTRAVENOUS | Status: DC | PRN
  Administered 2021-02-07 (×2): 10 mL via EPIDURAL

## 2021-02-07 MED ORDER — SODIUM CHLORIDE 0.9 % IV SOLN
2.0000 g | Freq: Four times a day (QID) | INTRAVENOUS | Status: AC
  Administered 2021-02-07 – 2021-02-08 (×4): 2 g via INTRAVENOUS
  Filled 2021-02-07 (×4): qty 2000

## 2021-02-07 MED ORDER — OXYTOCIN-SODIUM CHLORIDE 30-0.9 UT/500ML-% IV SOLN
INTRAVENOUS | Status: DC | PRN
  Administered 2021-02-07: 30 mL via INTRAVENOUS

## 2021-02-07 MED ORDER — SODIUM CHLORIDE 0.9 % IV SOLN: INTRAVENOUS | Status: DC | PRN

## 2021-02-07 MED ORDER — IBUPROFEN 800 MG PO TABS
800.0000 mg | ORAL_TABLET | Freq: Three times a day (TID) | ORAL | Status: DC
  Administered 2021-02-07 – 2021-02-08 (×2): 800 mg via ORAL
  Filled 2021-02-07 (×2): qty 1

## 2021-02-07 MED ORDER — MEPERIDINE HCL 25 MG/ML IJ SOLN: 6.2500 mg | INTRAMUSCULAR | Status: DC | PRN

## 2021-02-07 MED ORDER — KETOROLAC TROMETHAMINE 30 MG/ML IJ SOLN
30.0000 mg | Freq: Once | INTRAMUSCULAR | Status: AC
  Administered 2021-02-07: 30 mg via INTRAVENOUS

## 2021-02-07 MED ORDER — PRENATAL MULTIVITAMIN CH
1.0000 | ORAL_TABLET | Freq: Every day | ORAL | Status: DC
  Administered 2021-02-08: 1 via ORAL
  Filled 2021-02-07: qty 1

## 2021-02-07 MED ORDER — NALOXONE HCL 0.4 MG/ML IJ SOLN: 0.4000 mg | INTRAMUSCULAR | Status: DC | PRN

## 2021-02-07 MED ORDER — MORPHINE SULFATE (PF) 0.5 MG/ML IJ SOLN
INTRAMUSCULAR | Status: AC
  Filled 2021-02-07: qty 10

## 2021-02-07 MED ORDER — DEXAMETHASONE SODIUM PHOSPHATE 10 MG/ML IJ SOLN
INTRAMUSCULAR | Status: DC | PRN
  Administered 2021-02-07: 8 mg via INTRAVENOUS

## 2021-02-07 MED ORDER — DEXMEDETOMIDINE (PRECEDEX) IN NS 20 MCG/5ML (4 MCG/ML) IV SYRINGE
PREFILLED_SYRINGE | INTRAVENOUS | Status: AC
  Filled 2021-02-07: qty 5

## 2021-02-07 MED ORDER — FENTANYL CITRATE (PF) 100 MCG/2ML IJ SOLN
25.0000 ug | INTRAMUSCULAR | Status: DC | PRN
  Administered 2021-02-07 (×4): 25 ug via INTRAVENOUS

## 2021-02-07 MED ORDER — AMISULPRIDE (ANTIEMETIC) 5 MG/2ML IV SOLN: 10.0000 mg | Freq: Once | INTRAVENOUS | Status: DC | PRN

## 2021-02-07 MED ORDER — ACETAMINOPHEN 500 MG PO TABS: 1000.0000 mg | ORAL_TABLET | Freq: Four times a day (QID) | ORAL | Status: DC | PRN

## 2021-02-07 MED ORDER — ACETAMINOPHEN 10 MG/ML IV SOLN: 1000.0000 mg | Freq: Once | INTRAVENOUS | Status: DC | PRN

## 2021-02-07 MED ORDER — WITCH HAZEL-GLYCERIN EX PADS: 1.0000 "application " | MEDICATED_PAD | CUTANEOUS | Status: DC | PRN

## 2021-02-07 MED ORDER — ACETAMINOPHEN 500 MG PO TABS
1000.0000 mg | ORAL_TABLET | Freq: Four times a day (QID) | ORAL | Status: AC
  Administered 2021-02-07 – 2021-02-08 (×3): 1000 mg via ORAL
  Filled 2021-02-07 (×3): qty 2

## 2021-02-07 MED ORDER — DIPHENHYDRAMINE HCL 25 MG PO CAPS: 25.0000 mg | ORAL_CAPSULE | ORAL | Status: DC | PRN

## 2021-02-07 MED ORDER — OXYCODONE HCL 5 MG/5ML PO SOLN: 5.0000 mg | Freq: Once | ORAL | Status: DC | PRN

## 2021-02-07 MED ORDER — SIMETHICONE 80 MG PO CHEW: 80.0000 mg | CHEWABLE_TABLET | ORAL | Status: DC | PRN

## 2021-02-07 MED ORDER — ONDANSETRON HCL 4 MG/2ML IJ SOLN: 4.0000 mg | Freq: Three times a day (TID) | INTRAMUSCULAR | Status: DC | PRN

## 2021-02-07 MED ORDER — FENTANYL CITRATE (PF) 100 MCG/2ML IJ SOLN
INTRAMUSCULAR | Status: AC
  Filled 2021-02-07: qty 2

## 2021-02-07 MED ORDER — PROMETHAZINE HCL 25 MG/ML IJ SOLN: 6.2500 mg | INTRAMUSCULAR | Status: DC | PRN

## 2021-02-07 MED ORDER — OXYCODONE HCL 5 MG PO TABS
5.0000 mg | ORAL_TABLET | ORAL | Status: DC | PRN
  Administered 2021-02-08 (×2): 5 mg via ORAL
  Administered 2021-02-09 (×2): 10 mg via ORAL
  Filled 2021-02-07: qty 1
  Filled 2021-02-07 (×2): qty 2
  Filled 2021-02-07: qty 1

## 2021-02-07 MED ORDER — ONDANSETRON HCL 4 MG/2ML IJ SOLN
INTRAMUSCULAR | Status: DC | PRN
  Administered 2021-02-07: 4 mg via INTRAVENOUS

## 2021-02-07 MED ORDER — DEXMEDETOMIDINE (PRECEDEX) IN NS 20 MCG/5ML (4 MCG/ML) IV SYRINGE
PREFILLED_SYRINGE | INTRAVENOUS | Status: DC | PRN
  Administered 2021-02-07: 4 ug via INTRAVENOUS

## 2021-02-07 MED ORDER — ACETAMINOPHEN 500 MG PO TABS
1000.0000 mg | ORAL_TABLET | Freq: Once | ORAL | Status: AC
  Administered 2021-02-07: 1000 mg via ORAL
  Filled 2021-02-07: qty 2

## 2021-02-07 MED ORDER — BUPIVACAINE HCL (PF) 0.25 % IJ SOLN
INTRAMUSCULAR | Status: DC | PRN
  Administered 2021-02-07: 8 mL via EPIDURAL

## 2021-02-07 MED ORDER — MORPHINE SULFATE (PF) 0.5 MG/ML IJ SOLN
INTRAMUSCULAR | Status: DC | PRN
  Administered 2021-02-07: 3 mg via EPIDURAL

## 2021-02-07 MED ORDER — CLINDAMYCIN PHOSPHATE 900 MG/50ML IV SOLN
900.0000 mg | Freq: Three times a day (TID) | INTRAVENOUS | Status: AC
  Administered 2021-02-07 – 2021-02-08 (×3): 900 mg via INTRAVENOUS
  Filled 2021-02-07 (×3): qty 50

## 2021-02-07 MED ORDER — TETANUS-DIPHTH-ACELL PERTUSSIS 5-2.5-18.5 LF-MCG/0.5 IM SUSY: 0.5000 mL | PREFILLED_SYRINGE | Freq: Once | INTRAMUSCULAR | Status: DC

## 2021-02-07 MED ORDER — MORPHINE SULFATE (PF) 2 MG/ML IV SOLN: 1.0000 mg | INTRAVENOUS | Status: DC | PRN

## 2021-02-07 MED ORDER — FERROUS SULFATE 325 (65 FE) MG PO TABS
325.0000 mg | ORAL_TABLET | Freq: Every day | ORAL | Status: DC
  Administered 2021-02-08: 325 mg via ORAL
  Filled 2021-02-07: qty 1

## 2021-02-07 MED ORDER — ONDANSETRON 4 MG PO TBDP
4.0000 mg | ORAL_TABLET | Freq: Three times a day (TID) | ORAL | Status: DC | PRN
  Administered 2021-02-07: 4 mg via ORAL
  Filled 2021-02-07: qty 1

## 2021-02-07 MED ORDER — SODIUM CHLORIDE 0.9% FLUSH: 3.0000 mL | INTRAVENOUS | Status: DC | PRN

## 2021-02-07 MED ORDER — SCOPOLAMINE 1 MG/3DAYS TD PT72
1.0000 | MEDICATED_PATCH | TRANSDERMAL | Status: DC
  Administered 2021-02-07: 1.5 mg via TRANSDERMAL
  Filled 2021-02-07: qty 1

## 2021-02-07 MED ORDER — SIMETHICONE 80 MG PO CHEW
80.0000 mg | CHEWABLE_TABLET | Freq: Three times a day (TID) | ORAL | Status: DC
  Administered 2021-02-07 – 2021-02-09 (×5): 80 mg via ORAL
  Filled 2021-02-07 (×5): qty 1

## 2021-02-07 MED ORDER — TRANEXAMIC ACID-NACL 1000-0.7 MG/100ML-% IV SOLN
INTRAVENOUS | Status: DC | PRN
  Administered 2021-02-07: 1000 mg via INTRAVENOUS

## 2021-02-07 SURGICAL SUPPLY — 40 items
BENZOIN TINCTURE PRP APPL 2/3 (GAUZE/BANDAGES/DRESSINGS) ×2 IMPLANT
CHLORAPREP W/TINT 26 (MISCELLANEOUS) ×2 IMPLANT
CLAMP CORD UMBIL (MISCELLANEOUS) IMPLANT
CLOSURE STERI STRIP 1/2 X4 (GAUZE/BANDAGES/DRESSINGS) ×2 IMPLANT
CLOTH BEACON ORANGE TIMEOUT ST (SAFETY) ×2 IMPLANT
DRAPE C SECTION CLR SCREEN (DRAPES) ×2 IMPLANT
DRSG OPSITE POSTOP 4X10 (GAUZE/BANDAGES/DRESSINGS) ×2 IMPLANT
ELECT REM PT RETURN 9FT ADLT (ELECTROSURGICAL) ×2
ELECTRODE REM PT RTRN 9FT ADLT (ELECTROSURGICAL) ×1 IMPLANT
EXTRACTOR VACUUM KIWI (MISCELLANEOUS) IMPLANT
GLOVE BIO SURGEON STRL SZ7 (GLOVE) ×2 IMPLANT
GLOVE BIOGEL PI IND STRL 7.0 (GLOVE) ×2 IMPLANT
GLOVE BIOGEL PI INDICATOR 7.0 (GLOVE) ×2
GLOVE SURG POLYISO LF SZ6.5 (GLOVE) ×2 IMPLANT
GOWN STRL REUS W/ TWL LRG LVL3 (GOWN DISPOSABLE) ×3 IMPLANT
GOWN STRL REUS W/TWL LRG LVL3 (GOWN DISPOSABLE) ×6
KIT ABG SYR 3ML LUER SLIP (SYRINGE) IMPLANT
NEEDLE HYPO 25X5/8 SAFETYGLIDE (NEEDLE) IMPLANT
NS IRRIG 1000ML POUR BTL (IV SOLUTION) ×2 IMPLANT
PACK C SECTION WH (CUSTOM PROCEDURE TRAY) ×2 IMPLANT
PAD OB MATERNITY 4.3X12.25 (PERSONAL CARE ITEMS) ×2 IMPLANT
PENCIL BUTTON HOLSTER BLD 10FT (ELECTRODE) ×2 IMPLANT
PENCIL SMOKE EVAC W/HOLSTER (ELECTROSURGICAL) ×2 IMPLANT
RETAINER VISCERAL (MISCELLANEOUS) ×2 IMPLANT
RTRCTR C-SECT PINK 25CM LRG (MISCELLANEOUS) ×2 IMPLANT
STRIP CLOSURE SKIN 1/2X4 (GAUZE/BANDAGES/DRESSINGS) ×4 IMPLANT
SUT MNCRL 0 VIOLET CTX 36 (SUTURE) ×2 IMPLANT
SUT MNCRL+ AB 3-0 CT1 36 (SUTURE) ×2 IMPLANT
SUT MON AB 2-0 CT1 27 (SUTURE) ×2 IMPLANT
SUT MON AB-0 CT1 36 (SUTURE) ×2 IMPLANT
SUT MONOCRYL 0 CTX 36 (SUTURE) ×4
SUT MONOCRYL AB 3-0 CT1 36IN (SUTURE) ×4
SUT PDS AB 0 CTX 36 PDP370T (SUTURE) ×4 IMPLANT
SUT PLAIN 0 NONE (SUTURE) IMPLANT
SUT VIC AB 2-0 CT1 27 (SUTURE) ×2
SUT VIC AB 2-0 CT1 TAPERPNT 27 (SUTURE) ×1 IMPLANT
SUT VIC AB 4-0 KS 27 (SUTURE) ×2 IMPLANT
TOWEL OR 17X24 6PK STRL BLUE (TOWEL DISPOSABLE) ×4 IMPLANT
TRAY FOLEY W/BAG SLVR 14FR LF (SET/KITS/TRAYS/PACK) ×2 IMPLANT
WATER STERILE IRR 1000ML POUR (IV SOLUTION) ×2 IMPLANT

## 2021-02-07 NOTE — Progress Notes (Signed)
In to review indication for cesarean section with patient.  Previously signed consents and was counseled on risks which include but are not limited to risk of injury to surrounding organs, risk of blood loss, risk of need for blood transfusion, risk of infection, VTE, and other complications which could not have been predicted/prevented.  Due to non-reassuring fetal heart tracing (persistent Category II), development of chorioamnionitis, and lack of cervical change, cesarean section indicated. L&D staff and anesthesia aware. Will have cell saver available.  Patient has 2u pRBCs typed and cross on hold due to anemia.  Steva Ready, DO

## 2021-02-07 NOTE — Lactation Note (Signed)
This note was copied from a baby's chart. Lactation Consultation Note  Patient Name: Girl Roland Prine Today's Date: 02/07/2021 Reason for consult: Initial assessment;Term;1st time breastfeeding (LGA infant greater than 9 lbs at birth.) Age:22 hours  Mom's current feeding choice is breast and formula feeding. Infant had 3 stools and one void, MGM changed a void and stool diaper while LC was in the room. As LC entered the room, mom was having N&V but receptive of LC information. Per mom, infant had recently BF for 20 minutes and was supplemented with 4 mls of Similac Total Comfort with iron, prior to [facility] entering the room. Due mom not feeling well with Nausea,  she did not  want to re-latch infant at the breast. Infant was still cuing to feed,  LC discussed hand expression and infant was given 4 mls of colostrum by spoon. LC discussed breastfeed infant when infant is showing hunger cues, watch baby not clock,  to breastfeed infant by cues 8 to 12 or more times within 24 hours, STS. Mom knows to call RN or LC if she has questions, concerns or needs assistance with latching infant at the breast. Mom was given hand pump by RN, LC re-fitted mom with 27 mm breast flange. LC discussed infant's  I&O with mom. Mom made aware of O/P services, breastfeeding support groups, community resources, and our phone # for post-discharge questions.   Maternal Data Has patient been taught Hand Expression?: Yes Does the patient have breastfeeding experience prior to this delivery?: No  Feeding Mother's Current Feeding Choice: Breast Milk and Formula Nipple Type: Extra Slow Flow  LATCH Score                    Lactation Tools Discussed/Used    Interventions Interventions: Breast feeding basics reviewed;Skin to skin;Hand express;Expressed milk;Hand pump;Education  Discharge Pump: Manual WIC Program: Yes  Consult Status Consult Status: Follow-up Date: 02/08/21 Follow-up type:  In-patient    Danelle Earthly 02/07/2021, 3:54 PM

## 2021-02-07 NOTE — Progress Notes (Signed)
Subjective:    Called to bedside to evaluate FHR tracing. Pt reports feeling "pain" in her vagina and shivering. IUPC and FSE were dislodged w/ position changes. FHR baseline 200,  highest maternal temp 101.6. Starting Unasyn 3G IV and Tylenol 1G now. Pitting edema noted from thighs to feet.  Objective:    VS: BP (!) 145/95   Pulse (!) 104   Temp 97.9 F (36.6 C) (Oral)   Resp 16   Ht 5\' 5"  (1.651 m)   Wt 102.3 kg   LMP 03/04/2020 (Approximate)   SpO2 99%   BMI 37.54 kg/m  FHR : baseline 200 / variability moderate / accelerations absent / absent decelerations Toco: contractions every 2-4 minutes  Membranes: AROM x 13 hrs, remains clear, pt now febrile Dilation: 6 Effacement (%): 80 Cervical Position: Middle Station: Plus 1 Presentation: Vertex Exam by:: [email or web address], CNM   Assessment/Plan:   [name] [redacted]w[redacted]d IOL for dates [place]  Labor: protracted active phase, has not been adequate Preeclampsia:  Elevated BP, CBC, CMP, and protein creatinine ratio ordered Fetal Wellbeing:  Category II Pain Control:  Epidural I/D:  [place]-start Unasyn 3G Q6 hrs, give Tylenol 1G PO now Anticipated MOD:  will discuss assessment w/ Dr. [redacted]w[redacted]d MSN, CNM 02/07/2021 5:59 AM

## 2021-02-07 NOTE — Anesthesia Postprocedure Evaluation (Signed)
Anesthesia Post Note  Patient: Annette Archer  Procedure(s) Performed: CESAREAN SECTION APPLICATION OF CELL SAVER     Patient location during evaluation: Mother Baby Anesthesia Type: Epidural Level of consciousness: awake and alert Pain management: pain level controlled Vital Signs Assessment: post-procedure vital signs reviewed and stable Respiratory status: spontaneous breathing, nonlabored ventilation and respiratory function stable Cardiovascular status: stable Postop Assessment: no headache, no backache and epidural receding Anesthetic complications: no   No complications documented.  Last Vitals:  Vitals:   02/07/21 1030 02/07/21 1048  BP:  121/90  Pulse: 98 82  Resp: 16 17  Temp:  36.6 C  SpO2: 94% 97%    Last Pain:  Vitals:   02/07/21 1048  TempSrc: Oral  PainSc: 3    Pain Goal: Patients Stated Pain Goal: 3 (02/07/21 1048)              Epidural/Spinal Function Cutaneous sensation: Tingles (02/07/21 1048), Patient able to flex knees: Yes (02/07/21 1048), Patient able to lift hips off bed: Yes (02/07/21 1048), Back pain beyond tenderness at insertion site: No (02/07/21 1048), Progressively worsening motor and/or sensory loss: No (02/07/21 1048), Bowel and/or bladder incontinence post epidural: No (02/07/21 1048)  Mellody Dance

## 2021-02-07 NOTE — Op Note (Signed)
Pre Op Dx:   1. Single Live IUP at [redacted]w[redacted]d 2. Failure to progress 3. Chorioamnionitis 4. Non-reassuring fetal heart tracing Post Op Dx:  Same as pre-op  Procedure:  Low TransverseCesarean Section  Surgeon:  Dr. Steva Ready Assistants:  Dale Alvan, CNM Anesthesia:  Epidural  EBL:  325cc  IVF:  2000cc UOP:  25cc  Drains:  Foley catheter  Specimen removed:  Placenta - sent to pathology Device(s) implanted:  None Case Type:  Clean-contaminated Findings: Normal-appearing uterus, bilateral fallopian tubes, and ovaries. Fetus in cephalic position. Amniotic fluid clear. Complications: None Indications: [name] admitted for induction of labor for post-dates with failure to progress at 6cm and development of chorioamnionitis.  Fetal heart tracing was non-reassuring with variables, late decelerations, and spontaneous decelerations. Procedure:  After informed consent was obtained, the patient was brought to the operating room.  Following re-dosing of anesthesia, the patient was positioned in dorsal supine position with a leftward tilt and was prepped and draped in sterile fashion.  A preoperative time-out was performed.  The abdomen was entered in layers through a pfannenstiel incision and an Teacher, early years/pre was placed.  A low transverse hysterotomy was created sharply to the level of the membranes, then extended bluntly.  The fetus was delivered from cephalic presentation onto the field.  Bulb suctioning was performed.  The cord was doubly clamped and cut after a 60 second pause.  The newborn was passed to the warmer.  The placenta was delivered.  The uterus was swept free of clots and debris and closed in a running locked fashion with 0-Monocryl. A second imbricating layer was used to close the hysterotomy using 0-Monocryl.  Hemostasis was verified.  The abdomen was irrigated with warmed saline and cleared of clots.  The peritoneum was closed in a running fashion with 2-0 Vicryl.  Subfascial  spaces were inspected and hemostasis assured.  The fascia was closed in a running fashion with 0-PDS.  The subcutaneous tissues were irrigated and hemostasis assured.  The subcutaneous tissues were closed with 3-0 Monocryl in a deeper running layer and a superficial interrupted layer.  The skin was closed with 4-0 Vicryl.  A sterile bandage was applied.  The patient was transferred to PACU.  All needle, sponge, and instrument counts were correct at the end of the case.   Disposition:  PACU  I performed the procedure and the assistant was needed due to the complexity of the anatomy.  Steva Ready, DO

## 2021-02-07 NOTE — Transfer of Care (Signed)
Immediate Anesthesia Transfer of Care Note  Patient: Annette Archer  Procedure(s) Performed: CESAREAN SECTION APPLICATION OF CELL SAVER  Patient Location: PACU  Anesthesia Type:Epidural  Level of Consciousness: awake, alert  and oriented  Airway & Oxygen Therapy: Patient Spontanous Breathing  Post-op Assessment: Report given to RN and Post -op Vital signs reviewed and stable  Post vital signs: Reviewed and stable  Last Vitals:  Vitals Value Taken Time  BP    Temp    Pulse    Resp    SpO2      Last Pain:  Vitals:   02/07/21 0730  TempSrc:   PainSc: 0-No pain         Complications: No complications documented.

## 2021-02-08 ENCOUNTER — Encounter (HOSPITAL_COMMUNITY): Payer: Self-pay | Admitting: Obstetrics and Gynecology

## 2021-02-08 DIAGNOSIS — Z98891 History of uterine scar from previous surgery: Secondary | ICD-10-CM

## 2021-02-08 DIAGNOSIS — D509 Iron deficiency anemia, unspecified: Secondary | ICD-10-CM | POA: Diagnosis present

## 2021-02-08 LAB — CBC
HCT: 20.5 % — ABNORMAL LOW (ref 36.0–46.0)
HCT: 24.3 % — ABNORMAL LOW (ref 36.0–46.0)
Hemoglobin: 6.1 g/dL — CL (ref 12.0–15.0)
Hemoglobin: 7.4 g/dL — ABNORMAL LOW (ref 12.0–15.0)
MCH: 21.3 pg — ABNORMAL LOW (ref 26.0–34.0)
MCH: 22 pg — ABNORMAL LOW (ref 26.0–34.0)
MCHC: 29.8 g/dL — ABNORMAL LOW (ref 30.0–36.0)
MCHC: 30.5 g/dL (ref 30.0–36.0)
MCV: 71.4 fL — ABNORMAL LOW (ref 80.0–100.0)
MCV: 72.3 fL — ABNORMAL LOW (ref 80.0–100.0)
Platelets: 243 10*3/uL (ref 150–400)
Platelets: 247 10*3/uL (ref 150–400)
RBC: 2.87 MIL/uL — ABNORMAL LOW (ref 3.87–5.11)
RBC: 3.36 MIL/uL — ABNORMAL LOW (ref 3.87–5.11)
RDW: 19.4 % — ABNORMAL HIGH (ref 11.5–15.5)
RDW: 19.5 % — ABNORMAL HIGH (ref 11.5–15.5)
WBC: 18.1 10*3/uL — ABNORMAL HIGH (ref 4.0–10.5)
WBC: 14.8 10*3/uL — ABNORMAL HIGH (ref 4.0–10.5)
nRBC: 0.1 % (ref 0.0–0.2)
nRBC: 0.3 % — ABNORMAL HIGH (ref 0.0–0.2)

## 2021-02-08 LAB — PREPARE RBC (CROSSMATCH)

## 2021-02-08 MED ORDER — POLYSACCHARIDE IRON COMPLEX 150 MG PO CAPS
150.0000 mg | ORAL_CAPSULE | Freq: Every day | ORAL | Status: DC
  Administered 2021-02-08: 150 mg via ORAL

## 2021-02-08 MED ORDER — SODIUM CHLORIDE 0.9% IV SOLUTION: Freq: Once | INTRAVENOUS | Status: AC

## 2021-02-08 MED ORDER — MAGNESIUM OXIDE 400 (241.3 MG) MG PO TABS
400.0000 mg | ORAL_TABLET | Freq: Every day | ORAL | Status: DC
  Administered 2021-02-08: 400 mg via ORAL

## 2021-02-08 NOTE — Progress Notes (Signed)
Upon initial assessment, RN noted pt having nonproductive cough. Pt reported trying to cough today but the pain was stopping her from coughing. RN encouraged frequent use of incentive spirometer, ambulation & keeping a pillow on her abdomen when laughing, coughing, and sneezing to lessen incision pain. PT requests mucinex.  CNM on call notfied of patient's request. RN instructed to continue to encourage ambulation, drinking warm fluids, using the incentive spirometer that is at bedside.

## 2021-02-08 NOTE — Social Work (Signed)
MOB was referred for history of depression ADD, and oppositional defiant disorder.  * Referral screened out by Clinical Social Worker because none of the following criteria appear to apply:  ~ History of anxiety/depression during this pregnancy, or of post-partum depression following prior delivery. ~ Diagnosis of anxiety and/or depression within last 3 years. CSW notes a diagnosis date of 2016 or older. Per [facility] notes, MOB had behavioral issues as a child. OR * MOB's symptoms currently being treated with medication and/or therapy.  Please contact the Clinical Social Worker if needs arise, by [facility] request, or if MOB scores greater than 9/yes to question 10 on Edinburgh Postpartum Depression Screen.  Manfred Arch, LCSWA Clinical Social Work [employer] and [employer]  [phone]

## 2021-02-08 NOTE — Progress Notes (Signed)
Subjective: POD# 1 Information for the patient's newborn:  Annette, Annette Archer [[id number]]  female    [facility] Circumcision N/A  Reports feeling really good Feeding: breast and bottle Reports tolerating PO and denies N/V, foley removed, ambulating and urinating w/o difficulty  Pain controlled with PO meds Denies HA/SOB/dizziness  Flatus passing Vaginal bleeding is normal, no clots     Objective:  VS:  Vitals:   02/07/21 2005 02/07/21 2206 02/08/21 0010 02/08/21 0432  BP: 117/73  108/65 128/81  Pulse: 74  80 100  Resp: 20 20 18 20   Temp: 98.1 F (36.7 C)  97.8 F (36.6 C) 97.7 F (36.5 C)  TempSrc: Axillary  Oral Oral  SpO2: 98%  98% 98%  Weight:      Height:        Intake/Output Summary (Last 24 hours) at 02/08/2021 0701 Last data filed at 02/08/2021 04/10/2021 Gross per 24 hour  Intake 5217.97 ml  Output 1620 ml  Net 3597.97 ml     Recent Labs    02/07/21 0630 02/08/21 0559  WBC 14.2* 18.1*  HGB 7.9* 6.1*  HCT 26.1* 20.5*  PLT 261 243    Blood type: --/--/A POS Performed at [facility], [address], [place], [place]  [phone] (03/47) Rubella: Immune (09/09 0216)    Physical Exam:  General: alert, cooperative and no distress CV: Regular rate and rhythm Resp: clear Abdomen: soft, nontender, normal bowel sounds Incision: clean, dry, intact and Honeycomb dressing Perineum:  Uterine Fundus: firm, below umbilicus, nontender Lochia: appropriate, no clots Ext: 2+ pitting edema to BLE, neg for pain, tenderness, or cords   Assessment/Plan: 20 y.o.   POD# 1. G1P1001                  Active Problems:   Encounter for induction of labor   Gestational hypertension   Chorioamnionitis IDA    -Hgb 6.1    -consented for blood    -will transfuse 2 units PRBC   Routine post-op PP care          Lactation support  21, MSN, CNM 02/08/2021, 7:01 AM

## 2021-02-08 NOTE — Progress Notes (Addendum)
RN brought abd binder to patient room and attempted to assist in application. Pt reported that it hurt too much and she would rather wait until later to apply.

## 2021-02-08 NOTE — Progress Notes (Signed)
Pt voided in toilet. Pt reminded to void in measuring hat due to order for strict I&O.

## 2021-02-08 NOTE — Progress Notes (Signed)
Pt declined 2nd unit of blood, Rhea Pink CNM notified. No new orders received. Blood returned to blood bank

## 2021-02-08 NOTE — Progress Notes (Signed)
IV site infiltrated during infusion of first unit of PRBC and second IV started. IV site infiltrated after first unit was infused. Pt declines replacing IV site and declines seconf unit. Pt advised on R/B of declining blood. Pt asymptomatic. Will repeat CBC 4 hrs post-transfusion. Will notify Dr. Mora Appl.   Rhea Pink, MSN, CNM 02/08/2021 6:12 PM

## 2021-02-08 NOTE — Lactation Note (Signed)
This note was copied from a baby's chart. Lactation Consultation Note  Patient Name: Annette Archer SWFUX'N Date: 02/08/2021 Reason for consult: Follow-up assessment;Primapara;1st time breastfeeding;Term Age:22 hours   P1 mother whose infant is now 38 hours old.  This is a term baby at 41+1 weeks.  Mother's feeding preference is breast/bottle.  Mother receiving a blood transfusion when I arrived.  Baby was sleeping in father's lap.  Mother has been breast feeding: last LATCH score of a 9.  She has followed with supplementation.  Discussed appropriate volumes and baby's tummy size.  Mother inquiring about pumping with the DEBP.  She informed me that she does not like the manual pump at all.  Suggested mother call her RN when this unit of blood has infused and we could initiate the DEBP.  Mother verbalized understanding.  Mother will continue to feed with cues or at least 8-12 times/24 hours.  Reviewed feeding cues with parents.  Reminded her to always breast feed prior to any formula supplementation.    Mother is a [facility] participant in [facility] and is planning on receiving the formula package after discharge.  Father stated that he can buy a pump.  Discussed obtaining a high quality pump for effective breast stimulation.  Mother will call her RN when ready to use the DEBP.  RN updated.   Maternal Data Has patient been taught Hand Expression?: Yes Does the patient have breastfeeding experience prior to this delivery?: No  Feeding Mother's Current Feeding Choice: Breast Milk and Formula Nipple Type: Slow - flow  LATCH Score                    Lactation Tools Discussed/Used    Interventions    Discharge Pump: Manual (Mother does not like the manual pump; plans to call her RN to begin pumping with the DEBP after her blood transfusion.) WIC Program: Yes  Consult Status Consult Status: Follow-up Date: 02/09/21 Follow-up type: In-patient    Annette Archer 02/08/2021, 12:56 PM

## 2021-02-09 LAB — BPAM RBC
Blood Product Expiration Date: 202204242359
Blood Product Expiration Date: 202204262359
ISSUE DATE / TIME: 202204040958
ISSUE DATE / TIME: 202204041631
Unit Type and Rh: 6200
Unit Type and Rh: 6200

## 2021-02-09 LAB — TYPE AND SCREEN
ABO/RH(D): A POS
Antibody Screen: NEGATIVE
Unit division: 0
Unit division: 0

## 2021-02-09 MED ORDER — DM-GUAIFENESIN ER 30-600 MG PO TB12
1.0000 | ORAL_TABLET | Freq: Two times a day (BID) | ORAL | Status: DC
  Administered 2021-02-09: 1 via ORAL
  Filled 2021-02-09 (×2): qty 1

## 2021-02-09 MED ORDER — POLYSACCHARIDE IRON COMPLEX 150 MG PO CAPS: 150.0000 mg | ORAL_CAPSULE | Freq: Every day | ORAL | 1 refills | Status: DC

## 2021-02-09 MED ORDER — DM-GUAIFENESIN ER 30-600 MG PO TB12
1.0000 | ORAL_TABLET | Freq: Two times a day (BID) | ORAL | Status: DC
  Filled 2021-02-09: qty 1

## 2021-02-09 MED ORDER — OXYCODONE HCL 5 MG PO TABS: 5.0000 mg | ORAL_TABLET | ORAL | 0 refills | Status: DC | PRN

## 2021-02-09 NOTE — Progress Notes (Signed)
Patient reported voiding in toilet and not in hat. Pt reminded to place hat in toilet everytime she voids due to strict I/O

## 2021-02-09 NOTE — Lactation Note (Signed)
This note was copied from a baby's chart. Lactation Consultation Note  Patient Name: Annette Archer Today's Date: 02/09/2021   Age:22 hours  Mom reports she thinks she will just formula feed.  Plans to get formula from [place] and she has WIC.  Urged pumping.  Reasons discussed.  Urged to call lactation as needed.  Maternal Data    Feeding    LATCH Score                    Lactation Tools Discussed/Used    Interventions    Discharge    Consult Status      Neomia Dear 02/09/2021, 11:49 AM

## 2021-02-09 NOTE — Discharge Summary (Signed)
Cesarean Section OB Discharge Summary  Patient Name: Annette Archer DOB: 05-19-2001 MRN: [id number]  Date of admission: 02/06/2021 Intrauterine pregnancy: [redacted]w[redacted]d   Admitting diagnosis: Encounter for induction of labor [Z34.90] Secondary diagnosis: Gestational Hypertension  Date of discharge: 02/09/2021    Discharge diagnosis: Term Pregnancy Delivered     Prenatal history: G1P1001   EDC : 01/30/2021, by Other Basis  [facility] at [facility]  Primary provider : [facility] Prenatal course complicated by [facility]  Prenatal Labs: ABO, Rh: --/--/A POS Performed at [facility], [address], [place], [place] [address]  [phone])  Antibody: NEG (04/02 0039) Rubella: Immune (09/09 0216)   RPR: NON REACTIVE (04/02 0039)  HBsAg: Negative (09/09 0213)  HIV: Non-reactive (09/09 0213)  GBS: Negative/-- (02/28 0215)                                    Hospital course:  Induction of Labor With Cesarean Section   20 y.o. yo G1P1001 at [redacted]w[redacted]d was admitted to the hospital 02/06/2021 for induction of labor. Patient had a labor course significant for failure to progress, chorioamnionitis, NRFHTs.  The patient went for cesarean section due to Non-Reassuring FHR. Delivery details are as follows: Membrane Rupture Time/Date: 4:26 PM ,02/06/2021   Delivery Method:C-Section, Low Transverse  Details of operation can be found in separate operative Note.  Patient had an uncomplicated postpartum course. She is ambulating, tolerating a regular diet, passing flatus, and urinating well.  Patient is discharged home in stable condition on 02/09/21.      Newborn Data: Birth date:02/07/2021  Birth time:8:28 AM  Gender:Female  Living status:Living  Apgars:8 ,9  Weight:4265 g                                Delivering PROVIDER: DAVIES, MELISSA                                                            Complications: Intrauterine Inflammation or infection (Chorioamniotis) Pregnancy anemia.  Newborn Data: Live born  female  Birth Weight: 9 lb 6.4 oz (4265 g) APGAR: 8, 9  Newborn Delivery   Birth date/time: 02/07/2021 08:28:00 Delivery type: C-Section, Low Transverse Trial of labor: Yes C-section categorization: Primary      Baby Feeding: Breast Disposition:home with mother  Post partum procedures:blood transfusion x 1 unit PRBC. Pt declined 2nd unit.  Labs: Lab Results  Component Value Date   WBC 14.8 (H) 02/08/2021   HGB 7.4 (L) 02/08/2021   HCT 24.3 (L) 02/08/2021   MCV 72.3 (L) 02/08/2021   PLT 247 02/08/2021   CMP Latest Ref Rng & Units 02/07/2021  Glucose 70 - 99 mg/dL 87  BUN 6 - 20 mg/dL 6  Creatinine 0.09 - 2.33 mg/dL 0.07(M)  Sodium 226 - 333 mmol/L 131(L)  Potassium 3.5 - 5.1 mmol/L 4.1  Chloride 98 - 111 mmol/L 101  CO2 22 - 32 mmol/L 20(L)  Calcium 8.9 - 10.3 mg/dL 5.4(T)  Total Protein 6.5 - 8.1 g/dL 5.0(L)  Total Bilirubin 0.3 - 1.2 mg/dL 0.7  Alkaline Phos 38 - 126 U/L 103  AST 15 -  41 U/L 24  ALT 0 - 44 U/L 13    Physical Exam @ time of discharge:  Vitals:   02/08/21 1934 02/08/21 2015 02/09/21 0012 02/09/21 0344  BP:  (!) 146/98 (!) 139/94 (!) 135/93  Pulse:  93 95 93  Resp: 19 19 20 18   Temp:  98.4 F (36.9 C)  98.8 F (37.1 C)  TempSrc:  Oral Oral Oral  SpO2:  99% 95% 95%  Weight:      Height:       general: alert, cooperative and no distress, denies HA, visual disturbances or epigastric pain.  lochia: appropriate uterine fundus: firm perineum: intact incision: Dressing is clean, dry, and intact extremities: DVT Evaluation: No evidence of DVT seen on physical exam. Negative Homan's sign. No cords or calf tenderness. 1+ edema to bilateral lower extremities   Discharge instructions:  "Baby and Me Booklet" Discharge Medications: Niferex, Roxicodone 5mg , tylenol, ibuprofen, PNV  Diet: routine diet Activity: Advance as tolerated. Pelvic rest x 6 weeks.  Follow up:1 week for incision check and B/P check. Dr aware of pt status and  POC.  Signed: MSN, CNM 02/09/2021, 9:02 AM

## 2021-02-09 NOTE — Plan of Care (Signed)
Patient having some noticeable SOB with activity.  Vital signs checked and stable.  Pt sat on bed and now breathing normally.  Notified CNM.  Discussed HGB and patient's refusal of 2nd unit of PRBCs. No new orders

## 2021-02-10 LAB — SURGICAL PATHOLOGY

## 2021-02-14 LAB — PROTEIN / CREATININE RATIO, URINE
Creatinine, Urine: 162.4 mg/dL
Protein Creatinine Ratio: 3.36 mg/mg{Cre} — ABNORMAL HIGH (ref 0.00–0.15)
Total Protein, Urine: 546 mg/dL

## 2021-08-18 ENCOUNTER — Other Ambulatory Visit: Payer: Self-pay

## 2021-08-18 ENCOUNTER — Encounter (HOSPITAL_COMMUNITY): Payer: Self-pay

## 2021-08-18 ENCOUNTER — Ambulatory Visit (HOSPITAL_COMMUNITY)
Admission: EM | Admit: 2021-08-18 | Discharge: 2021-08-18 | Disposition: A | Discharge status: Home / Self Care | Payer: Medicaid Other | Attending: Internal Medicine | Admitting: Internal Medicine

## 2021-08-18 DIAGNOSIS — N390 Urinary tract infection, site not specified: Secondary | ICD-10-CM | POA: Insufficient documentation

## 2021-08-18 DIAGNOSIS — R319 Hematuria, unspecified: Secondary | ICD-10-CM | POA: Diagnosis present

## 2021-08-18 LAB — POCT URINALYSIS DIPSTICK, ED / UC
Bilirubin Urine: NEGATIVE
Glucose, UA: NEGATIVE mg/dL
Ketones, ur: NEGATIVE mg/dL
Nitrite: POSITIVE — AB
Protein, ur: 100 mg/dL — AB
Specific Gravity, Urine: >=1.03 (ref 1.005–1.030)
Urobilinogen, UA: 0.2 mg/dL (ref 0.0–1.0)
pH: 6 (ref 5.0–8.0)

## 2021-08-18 LAB — POC URINE PREG, ED: Preg Test, Ur: NEGATIVE

## 2021-08-18 MED ORDER — ACETAMINOPHEN 325 MG PO TABS
ORAL_TABLET | ORAL | Status: AC
  Filled 2021-08-18: qty 2

## 2021-08-18 MED ORDER — CEPHALEXIN 500 MG PO CAPS: 500.0000 mg | ORAL_CAPSULE | Freq: Four times a day (QID) | ORAL | 0 refills | Status: AC

## 2021-08-18 MED ORDER — ACETAMINOPHEN 325 MG PO TABS
650.0000 mg | ORAL_TABLET | Freq: Once | ORAL | Status: AC
  Administered 2021-08-18: 650 mg via ORAL

## 2021-08-18 NOTE — ED Provider Notes (Signed)
[facility]    CSN: [id number] Arrival date & time: 08/18/21  0855      History   Chief Complaint Chief Complaint  Patient presents with   Chills   Generalized Body Aches   Fever    HPI Annette Archer is a 20 y.o. female.   The history is provided by the patient. No language interpreter was used.  Fever Max temp prior to arrival:  101 Temp source:  Subjective Severity:  Moderate Onset quality:  Gradual Duration:  1 day Timing:  Constant Progression:  Worsening Relieved by:  Nothing Worsened by:  Nothing Ineffective treatments:  None tried Associated symptoms: no sore throat    Past Medical History:  Diagnosis Date   ADHD (attention deficit hyperactivity disorder)    Anemia    Depression    ODD (oppositional defiant disorder)     Patient Active Problem List   Diagnosis Date Noted   IDA (iron deficiency anemia) 02/08/2021   Failed induction of labor, delivered 02/08/2021   Status post primary low transverse cesarean section 02/08/2021   Chorioamnionitis 02/07/2021   Encounter for induction of labor 02/06/2021   Gestational hypertension 02/06/2021   ODD (oppositional defiant disorder)     Past Surgical History:  Procedure Laterality Date   CESAREAN SECTION  02/07/2021   Procedure: CESAREAN SECTION;  Surgeon: Steva Ready, DO;  Location: [facility];  Service: Obstetrics;;   NO PAST SURGERIES      OB History     Gravida  1   Para  1   Term  1   Preterm      AB      Living  1      SAB      IAB      Ectopic      Multiple  0   Live Births  1            Home Medications    Prior to Admission medications   Medication Sig Start Date End Date Taking? Authorizing Provider  cephALEXin (KEFLEX) 500 MG capsule Take 1 capsule (500 mg total) by mouth 4 (four) times daily for 10 days. 08/18/21 08/28/21 Yes Elson Areas, PA-C  iron polysaccharides (NIFEREX) 150 MG capsule Take 1 capsule (150 mg total) by mouth daily.  02/09/21   Holshouser, Gerhard Munch, CNM  oxyCODONE (OXY IR/ROXICODONE) 5 MG immediate release tablet Take 1-2 tablets (5-10 mg total) by mouth every 4 (four) hours as needed for moderate pain. 02/09/21   Holshouser, Gerhard Munch, CNM  Prenat w/o A-[id number]-[id number]-FA-DHA (CITRANATAL 90 DHA) 90-1 & 300 MG MISC Take 1 tablet by mouth daily. 09/16/20   [provider]    Family History Family History  Problem Relation Age of Onset   Healthy Mother    Healthy Father     Social History Social History   Tobacco Use   Smoking status: Never   Smokeless tobacco: Never  Vaping Use   Vaping Use: Never used  Substance Use Topics   Alcohol use: Not Currently   Drug use: Not Currently    Types: Marijuana     Allergies   Caffeine, Red dye, and Yellow dyes (non-tartrazine)   Review of Systems Review of Systems  Constitutional:  Positive for fever.  HENT:  Negative for sore throat.   All other systems reviewed and are negative.   Physical Exam Triage Vital Signs ED Triage Vitals  Enc Vitals Group     BP 08/18/21 1054 114/64  Pulse Rate 08/18/21 1054 (!) 108     Resp 08/18/21 1054 20     Temp 08/18/21 1054 (!) 101.7 F (38.7 C)     Temp Source 08/18/21 1054 Oral     SpO2 08/18/21 1054 100 %     Weight --      Height --      Head Circumference --      Peak Flow --      Pain Score 08/18/21 1059 6     Pain Loc --      Pain Edu? --      Excl. in GC? --    No data found.  Updated Vital Signs BP 114/64 (BP Location: Left Arm)   Pulse (!) 108   Temp (!) 101.7 F (38.7 C) (Oral)   Resp 20   LMP 08/04/2021   SpO2 100%   Breastfeeding No   Visual Acuity Right Eye Distance:   Left Eye Distance:   Bilateral Distance:    Right Eye Near:   Left Eye Near:    Bilateral Near:     Physical Exam Vitals reviewed.  Cardiovascular:     Rate and Rhythm: Normal rate.  Pulmonary:     Effort: Pulmonary effort is normal.  Musculoskeletal:        General: Normal range of  motion.  Skin:    General: Skin is warm.  Neurological:     General: No focal deficit present.     Mental Status: She is alert.  Psychiatric:        Mood and Affect: Mood normal.     UC Treatments / Results  Labs (all labs ordered are listed, but only abnormal results are displayed) Labs Reviewed  POCT URINALYSIS DIPSTICK, ED / UC - Abnormal; Notable for the following components:      Result Value   Hgb urine dipstick MODERATE (*)    Protein, ur 100 (*)    Nitrite POSITIVE (*)    Leukocytes,Ua SMALL (*)    All other components within normal limits  URINE CULTURE  POC URINE PREG, ED    EKG   Radiology No results found.  Procedures Procedures (including critical care time)  Medications Ordered in UC Medications  acetaminophen (TYLENOL) tablet 650 mg (650 mg Oral Given 08/18/21 1134)    Initial Impression / Assessment and Plan / UC Course  I have reviewed the triage vital signs and the nursing notes.  Pertinent labs & imaging results that were available during my care of the patient were reviewed by me and considered in my medical decision making (see chart for details).     MDM: UA shows moderate hemoglobin nitrates and leukocytes  Final Clinical Impressions(s) / UC Diagnoses   Final diagnoses:  Urinary tract infection with hematuria, site unspecified     Discharge Instructions      Return if any problems.   ED Prescriptions     Medication Sig Dispense Auth. Provider   cephALEXin (KEFLEX) 500 MG capsule Take 1 capsule (500 mg total) by mouth 4 (four) times daily for 10 days. 40 capsule Elson Areas, [place]      PDMP not reviewed this encounter. An After Visit Summary was printed and given to the patient.    Elson Areas, [place] 08/18/21 1238

## 2021-08-18 NOTE — ED Triage Notes (Signed)
Pt presents with chills, fatigue, fever, and generalized body aches X 2 days.

## 2021-08-18 NOTE — Discharge Instructions (Addendum)
Return if any problems.

## 2021-08-20 LAB — URINE CULTURE: Culture: >=100000 — AB

## 2021-09-23 ENCOUNTER — Ambulatory Visit: Payer: Medicaid Other

## 2021-11-28 ENCOUNTER — Encounter (HOSPITAL_COMMUNITY): Payer: Self-pay | Admitting: Obstetrics & Gynecology

## 2021-11-28 ENCOUNTER — Encounter (HOSPITAL_COMMUNITY): Payer: Self-pay | Admitting: Anesthesiology

## 2021-11-28 ENCOUNTER — Inpatient Hospital Stay (HOSPITAL_COMMUNITY): Payer: Medicaid Other

## 2021-11-28 ENCOUNTER — Other Ambulatory Visit: Payer: Self-pay

## 2021-11-28 ENCOUNTER — Inpatient Hospital Stay (HOSPITAL_COMMUNITY)
Admission: AD | Admit: 2021-11-28 | Discharge: 2021-11-28 | Disposition: A | Discharge status: Home / Self Care | Payer: Medicaid Other | Attending: Obstetrics & Gynecology | Admitting: Obstetrics & Gynecology

## 2021-11-28 DIAGNOSIS — Z3A01 Less than 8 weeks gestation of pregnancy: Secondary | ICD-10-CM | POA: Diagnosis not present

## 2021-11-28 DIAGNOSIS — O26891 Other specified pregnancy related conditions, first trimester: Secondary | ICD-10-CM | POA: Insufficient documentation

## 2021-11-28 DIAGNOSIS — O26851 Spotting complicating pregnancy, first trimester: Secondary | ICD-10-CM | POA: Insufficient documentation

## 2021-11-28 DIAGNOSIS — B3731 Acute candidiasis of vulva and vagina: Secondary | ICD-10-CM | POA: Diagnosis not present

## 2021-11-28 DIAGNOSIS — R109 Unspecified abdominal pain: Secondary | ICD-10-CM | POA: Insufficient documentation

## 2021-11-28 DIAGNOSIS — O209 Hemorrhage in early pregnancy, unspecified: Secondary | ICD-10-CM

## 2021-11-28 DIAGNOSIS — O23591 Infection of other part of genital tract in pregnancy, first trimester: Secondary | ICD-10-CM | POA: Diagnosis not present

## 2021-11-28 DIAGNOSIS — O3680X0 Pregnancy with inconclusive fetal viability, not applicable or unspecified: Secondary | ICD-10-CM | POA: Diagnosis not present

## 2021-11-28 LAB — ABO/RH: ABO/RH(D): A POS

## 2021-11-28 LAB — URINALYSIS, ROUTINE W REFLEX MICROSCOPIC
Bilirubin Urine: NEGATIVE
Glucose, UA: NEGATIVE mg/dL
Hgb urine dipstick: NEGATIVE
Ketones, ur: 5 mg/dL — AB
Nitrite: NEGATIVE
Protein, ur: 30 mg/dL — AB
Specific Gravity, Urine: 1.033 — ABNORMAL HIGH (ref 1.005–1.030)
pH: 5 (ref 5.0–8.0)

## 2021-11-28 LAB — CBC
HCT: 33.7 % — ABNORMAL LOW (ref 36.0–46.0)
Hemoglobin: 11.2 g/dL — ABNORMAL LOW (ref 12.0–15.0)
MCH: 26.6 pg (ref 26.0–34.0)
MCHC: 33.2 g/dL (ref 30.0–36.0)
MCV: 80 fL (ref 80.0–100.0)
Platelets: 265 10*3/uL (ref 150–400)
RBC: 4.21 MIL/uL (ref 3.87–5.11)
RDW: 15.9 % — ABNORMAL HIGH (ref 11.5–15.5)
WBC: 6.4 10*3/uL (ref 4.0–10.5)
nRBC: 0 % (ref 0.0–0.2)

## 2021-11-28 LAB — RAPID URINE DRUG SCREEN, HOSP PERFORMED
Amphetamines: POSITIVE — AB
Barbiturates: NOT DETECTED
Benzodiazepines: NOT DETECTED
Cocaine: NOT DETECTED
Opiates: NOT DETECTED
Tetrahydrocannabinol: POSITIVE — AB

## 2021-11-28 LAB — WET PREP, GENITAL
Clue Cells Wet Prep HPF POC: NONE SEEN
Sperm: NONE SEEN
Trich, Wet Prep: NONE SEEN
WBC, Wet Prep HPF POC: >=10 — AB (ref <10)

## 2021-11-28 LAB — POCT PREGNANCY, URINE: Preg Test, Ur: POSITIVE — AB

## 2021-11-28 LAB — HCG, QUANTITATIVE, PREGNANCY: hCG, Beta Chain, Quant, S: 1530 m[IU]/mL — ABNORMAL HIGH (ref <5)

## 2021-11-28 MED ORDER — TERCONAZOLE 0.4 % VA CREA: 1.0000 | TOPICAL_CREAM | Freq: Every day | VAGINAL | 0 refills | Status: DC

## 2021-11-28 NOTE — MAU Provider Note (Signed)
Chief Complaint:  Abdominal Pain  HPI: Annette Archer is a 21 y.o. G2P1001 at Unknown who presents to maternity admissions reporting mild left sided abdominal pain and spotting yesterday that has gotten better and is now just white to clear discharge. Last IC was 11/27/21.  Also concerned because she admits to using marijuana, drinking alcohol and taking an ecstasy pill last week prior to finding out that she was pregnant. Reports malodorous urine and thinks she may have a UTI, has not been drinking enough water but has no urinary symptoms. No other physical complaints.  Pregnancy Course: Will receive care at [facility], delivered her first child last April under their care.  Past Medical History:  Diagnosis Date   ADHD (attention deficit hyperactivity disorder)    Anemia    Depression    ODD (oppositional defiant disorder)    OB History  Gravida Para Term Preterm AB Living  2 1 1  0 0 1  SAB IAB Ectopic Multiple Live Births  0 0 0 0 1    # Outcome Date GA Lbr Len/2nd Weight Sex Delivery Anes PTL Lv  2 Current           1 Term 02/07/21 [redacted]w[redacted]d  9 lb 6.4 oz (4.265 kg) F CS-LTranv EPI  LIV   Past Surgical History:  Procedure Laterality Date   CESAREAN SECTION  02/07/2021   Procedure: CESAREAN SECTION;  Surgeon: 04/09/2021, DO;  [facility]: [facility];  Service: Obstetrics;;   NO PAST SURGERIES     Family History  Problem Relation Age of Onset   Healthy Mother    Healthy Father    Social History   Tobacco Use   Smoking status: Some Days    Types: Cigarettes   Smokeless tobacco: Never   Tobacco comments:    cigars  Vaping Use   Vaping Use: Never used  Substance Use Topics   Alcohol use: Not Currently   Drug use: Not Currently    Types: Marijuana, Other-see comments, MDMA (Ecstacy)    Comment: 11/24/21 took ecstacy; last used marijuana 11/25/21   Allergies  Allergen Reactions   Caffeine Other (See Comments)    insomnia   Red Dye     Can not take because of adderall. Patient is  able to tolerate other medications with dye (confirmed 02/07/21)   Yellow Dyes (Non-Tartrazine) Other (See Comments)    Per pts mother she can not have because she takes adderall. Patient is able to tolerate other medications with dye (confirmed 02/07/21)   No medications prior to admission.   I have reviewed patient's Past Medical Hx, Surgical Hx, Family Hx, Social Hx, medications and allergies.   ROS:  Review of Systems  Constitutional:  Negative for fatigue and fever.  HENT:  Negative for congestion and sore throat.   Respiratory:  Negative for cough and shortness of breath.   Gastrointestinal:  Positive for abdominal pain (left side on her side and into her upper pelvis).  Genitourinary:  Positive for vaginal bleeding (spotting that happened yesterday).  Musculoskeletal:  Negative for back pain.  Neurological:  Negative for dizziness, syncope, light-headedness and headaches.   Physical Exam  Patient Vitals for the past 24 hrs:  BP Temp Temp src Pulse Resp SpO2  11/28/21 1718 115/72 -- -- 100 18 99 %  11/28/21 1705 110/63 98.2 F (36.8 C) Oral 99 17 100 %   Constitutional: Well-developed, well-nourished female in no acute distress.  Cardiovascular: normal rate & rhythm Respiratory: normal effort GI:  Abd soft, non-tender, gravid appropriate for gestational age MS: Extremities nontender, no edema, normal ROM Neurologic: Alert and oriented x 4.  GU: no CVA tenderness Pelvic: deferred, RN collected swabs, sent to U/S   Labs: Results for orders placed or performed during the hospital encounter of 11/28/21 (from the past 24 hour(s))  Pregnancy, urine POC     Status: Abnormal   Collection Time: 11/28/21  4:53 PM  Result Value Ref Range   Preg Test, Ur POSITIVE (A) NEGATIVE  Urinalysis, Routine w reflex microscopic Urine, Clean Catch     Status: Abnormal   Collection Time: 11/28/21  5:18 PM  Result Value Ref Range   Color, Urine AMBER (A) YELLOW   APPearance CLOUDY (A) CLEAR    Specific Gravity, Urine 1.033 (H) 1.005 - 1.030   pH 5.0 5.0 - 8.0   Glucose, UA NEGATIVE NEGATIVE mg/dL   Hgb urine dipstick NEGATIVE NEGATIVE   Bilirubin Urine NEGATIVE NEGATIVE   Ketones, ur 5 (A) NEGATIVE mg/dL   Protein, ur 30 (A) NEGATIVE mg/dL   Nitrite NEGATIVE NEGATIVE   Leukocytes,Ua SMALL (A) NEGATIVE   RBC / HPF 6-10 0 - 5 RBC/hpf   WBC, UA 0-5 0 - 5 WBC/hpf   Bacteria, UA RARE (A) NONE SEEN   Squamous Epithelial / LPF 11-20 0 - 5   Mucus PRESENT   Wet prep, genital     Status: Abnormal   Collection Time: 11/28/21  6:18 PM   Specimen: PATH Cytology Cervicovaginal Ancillary Only  Result Value Ref Range   Yeast Wet Prep HPF POC PRESENT (A) NONE SEEN   Trich, Wet Prep NONE SEEN NONE SEEN   Clue Cells Wet Prep HPF POC NONE SEEN NONE SEEN   WBC, Wet Prep HPF POC >=10 (A) <10   Sperm NONE SEEN   CBC     Status: Abnormal   Collection Time: 11/28/21  6:28 PM  Result Value Ref Range   WBC 6.4 4.0 - 10.5 K/uL   RBC 4.21 3.87 - 5.11 MIL/uL   Hemoglobin 11.2 (L) 12.0 - 15.0 g/dL   HCT 93.2 (L) 35.5 - 73.2 %   MCV 80.0 80.0 - 100.0 fL   MCH 26.6 26.0 - 34.0 pg   MCHC 33.2 30.0 - 36.0 g/dL   RDW 20.2 (H) 54.2 - 70.6 %   Platelets 265 150 - 400 K/uL   nRBC 0.0 0.0 - 0.2 %  ABO/Rh     Status: None   Collection Time: 11/28/21  6:28 PM  Result Value Ref Range   ABO/RH(D) A POS    No rh immune globuloin      NOT A RH IMMUNE GLOBULIN CANDIDATE, PT RH POSITIVE Performed at [facility], [address] [place], [place] [address]   hCG, quantitative, pregnancy     Status: Abnormal   Collection Time: 11/28/21  6:28 PM  Result Value Ref Range   hCG, Beta Chain, Quant, S 1,530 (H) <5 mIU/mL   Imaging:  US OB LESS THAN 14 WEEKS WITH OB TRANSVAGINAL  Result Date: 11/28/2021 CLINICAL DATA:  Initial evaluation for early pregnancy. EXAM: OBSTETRIC <14 WK [place] AND TRANSVAGINAL OB US TECHNIQUE: Both transabdominal and transvaginal ultrasound examinations were performed for  complete evaluation of the gestation as well as the maternal uterus, adnexal regions, and pelvic cul-de-sac. Transvaginal technique was performed to assess early pregnancy. COMPARISON:  None. FINDINGS: Intrauterine gestational sac: Single Yolk sac:  Negative. Embryo:  Negative. Cardiac Activity: Negative. Heart Rate:  N/A MSD: 3.3 mm   5 w   0 d Subchorionic hemorrhage:  None visualized. Maternal uterus/adnexae: Ovaries within normal limits. No adnexal mass. Small volume free fluid seen within the pelvis. IMPRESSION: 1. Probable early intrauterine gestational sac, but no yolk sac, fetal pole, or cardiac activity yet visualized. Recommend follow-up quantitative B-HCG levels and follow-up US in 14 days to confirm and assess viability. This recommendation follows SRU consensus guidelines: Diagnostic Criteria for Nonviable Pregnancy Early in the First Trimester. Malva Limes Engl J Med 2013; 604:5409-81; 369:1443-51. 2. Small volume free fluid within the pelvis. 3. No other acute maternal uterine or adnexal abnormality. Electronically Signed   By: Rise MuBenjamin  McClintock M.D.   On: 11/28/2021 19:04    MAU Course: Orders Placed This Encounter  Procedures   Wet prep, genital   Culture, OB Urine   US OB LESS THAN 14 WEEKS WITH OB TRANSVAGINAL   Urinalysis, Routine w reflex microscopic Urine, Clean Catch   CBC   hCG, quantitative, pregnancy   Rapid urine drug screen (hospital performed)   Pregnancy, urine POC   ABO/Rh   Discharge patient   Meds ordered this encounter  Medications   terconazole (TERAZOL 7) 0.4 % vaginal cream    Sig: Place 1 applicator vaginally at bedtime. Use for seven days    Dispense:  45 g    Refill:  0    Order Specific Question:   Supervising Provider    Answer:   Samara SnidePRATT, TANYA S [[id number]]   MDM: Per pt's report of substance use, UDS offered to patient to make sure she only ingested what she thinks and none of it was laced. Pt verbalized consent to test, wants to make sure she hasn't accidentally taken  anything else. Plans to stop using all recreational substances while pregnant. Labs and UDS collected, pt sent to U/S. Discussed results with patient and importance of [facility] in two [facility] Report called to Dr. Mora ApplPinn who requested her CNM be called. Spoke to Chesapeake EnergyEmily Slaughterbeck, CNM who will insure pt has an appointment in two days. Script for yeast sent to pharmacy, UDS pending but pt stable for discharge home with close follow up at [facility].   Assessment: 1. Pregnancy of unknown anatomic location   2. Vaginal bleeding in pregnancy, first trimester   3. [redacted] weeks gestation of pregnancy   4. Yeast vaginitis    Plan: Discharge home in stable condition with ectopic precautions.     Follow-up Information     [facility] [place] up in 2 day(s).   Specialty: Obstetrics and Gynecology Why: The office will call you to get your follow up appointment scheduled Contact information: [address] [place] [id number] [phone]                 Allergies as of 11/28/2021       Reactions   Caffeine Other (See Comments)   insomnia   Red Dye    Can not take because of adderall. Patient is able to tolerate other medications with dye (confirmed 02/07/21)   Yellow Dyes (non-tartrazine) Other (See Comments)   Per pts mother she can not have because she takes adderall. Patient is able to tolerate other medications with dye (confirmed 02/07/21)        Medication List     TAKE these medications    CitraNatal 90 DHA 90-1 & 300 MG Misc Take 1 tablet by mouth daily.   iron polysaccharides 150  MG capsule Commonly known as: NIFEREX Take 1 capsule (150 mg total) by mouth daily.   oxyCODONE 5 MG immediate release tablet Commonly known as: Oxy IR/ROXICODONE Take 1-2 tablets (5-10 mg total) by mouth every 4 (four) hours as needed for moderate pain.   terconazole 0.4 % vaginal cream Commonly known as: TERAZOL 7 Place 1 applicator vaginally  at bedtime. Use for seven days       Edd ArbourJamilla Everest Hacking, CNM, MSN, [employer] Certified Nurse Midwife, [facility]

## 2021-11-28 NOTE — MAU Note (Addendum)
...  Annette Archer is a 21 y.o. at Unknown here in MAU reporting: Last night she had one episode of light pink vaginal discharge. She states it is now yellow and thick. Unsure of LMP, she states she has irregular periods and has only bled twice since having her baby last year in April.   She is also experiencing urinary frequency for the past week and states when she urinates she feels like she is unable to empty her bladder.  Pain score: 7/10 left flank/hip pain  Lab orders placed from triage: UA

## 2021-11-29 ENCOUNTER — Encounter: Payer: Self-pay | Admitting: Certified Nurse Midwife

## 2021-11-29 DIAGNOSIS — R8271 Bacteriuria: Secondary | ICD-10-CM | POA: Insufficient documentation

## 2021-11-29 LAB — GC/CHLAMYDIA PROBE AMP (~~LOC~~) NOT AT ARMC
Chlamydia: NEGATIVE
Comment: NEGATIVE
Comment: NORMAL
Neisseria Gonorrhea: NEGATIVE

## 2021-11-29 LAB — CULTURE, OB URINE: Culture: 10000 — AB

## 2021-11-30 ENCOUNTER — Other Ambulatory Visit (INDEPENDENT_AMBULATORY_CARE_PROVIDER_SITE_OTHER): Payer: Medicaid Other | Admitting: Obstetrics and Gynecology

## 2021-11-30 ENCOUNTER — Telehealth: Payer: Self-pay | Admitting: Obstetrics and Gynecology

## 2021-11-30 DIAGNOSIS — R8271 Bacteriuria: Secondary | ICD-10-CM

## 2021-11-30 MED ORDER — AMOXICILLIN 500 MG PO CAPS: 500.0000 mg | ORAL_CAPSULE | Freq: Three times a day (TID) | ORAL | 0 refills | Status: AC

## 2021-11-30 NOTE — Telephone Encounter (Signed)
My Chart message sent

## 2021-11-30 NOTE — Progress Notes (Signed)
Tried to contact patient via TC. No answer. See telephone encounter.  Raelyn Mora, CNM

## 2021-12-21 LAB — OB RESULTS CONSOLE HEPATITIS B SURFACE ANTIGEN: Hepatitis B Surface Ag: NEGATIVE

## 2021-12-21 LAB — OB RESULTS CONSOLE ANTIBODY SCREEN: Antibody Screen: NEGATIVE

## 2021-12-21 LAB — OB RESULTS CONSOLE GC/CHLAMYDIA
Chlamydia: NEGATIVE
Neisseria Gonorrhea: NEGATIVE

## 2021-12-21 LAB — HEPATITIS C ANTIBODY: HCV Ab: NEGATIVE

## 2021-12-21 LAB — OB RESULTS CONSOLE ABO/RH: RH Type: POSITIVE

## 2021-12-21 LAB — OB RESULTS CONSOLE RPR: RPR: NONREACTIVE

## 2021-12-21 LAB — OB RESULTS CONSOLE HIV ANTIBODY (ROUTINE TESTING): HIV: NONREACTIVE

## 2021-12-21 LAB — OB RESULTS CONSOLE RUBELLA ANTIBODY, IGM: Rubella: IMMUNE

## 2022-03-29 ENCOUNTER — Ambulatory Visit (INDEPENDENT_AMBULATORY_CARE_PROVIDER_SITE_OTHER): Payer: Medicaid Other | Admitting: Internal Medicine

## 2022-03-29 ENCOUNTER — Encounter: Payer: Self-pay | Admitting: Internal Medicine

## 2022-03-29 VITALS — BP 120/79 | HR 83 | Ht 65.0 in | Wt 167.4 lb

## 2022-03-29 DIAGNOSIS — O99019 Anemia complicating pregnancy, unspecified trimester: Secondary | ICD-10-CM

## 2022-03-29 DIAGNOSIS — Z331 Pregnant state, incidental: Secondary | ICD-10-CM | POA: Diagnosis not present

## 2022-03-29 DIAGNOSIS — Z98891 History of uterine scar from previous surgery: Secondary | ICD-10-CM

## 2022-03-29 DIAGNOSIS — R002 Palpitations: Secondary | ICD-10-CM

## 2022-03-29 NOTE — Progress Notes (Signed)
Cardiology Office Note:    Date: 03/29/2022  ID:  Annette Archer, DOB 09-06-2001, MRN [id number]  PCP:  Patient, No Pcp Per  Cardiologist:  None  Electrophysiologist:  None   Referring MD: Osborn Coho, MD   Chief Complaint/Reason for Referral: Palpitations in current pregnancy  History of Present Illness:    Annette Archer is a 21 y.o. female with a history of ADHD, anemia, depression who presents with palpitations.  Currently pregnant, notes fatigue, SOB, chest discomfort and palpitations. Notes her most recent Hgb 10.5 at [facility] She has been prescribed iron but has not started. Notes not hydrating as well as she could. No known history of congenital heart disease or major health issues. One prior term delivery, brings her daughter with her today. No documented history of GHTN or GDM.  The patient denies PND, orthopnea, or leg swelling. Denies cough, fever, chills. Denies nausea, vomiting. Denies syncope or presyncope. Denies dizziness or lightheadedness. Denies snoring.   Past Medical History:  Diagnosis Date   ADHD (attention deficit hyperactivity disorder)    Anemia    Depression    ODD (oppositional defiant disorder)     Past Surgical History:  Procedure Laterality Date   CESAREAN SECTION  02/07/2021   Procedure: CESAREAN SECTION;  Surgeon: Steva Ready, DO;  Location: [facility];  Service: Obstetrics;;   NO PAST SURGERIES      Current Medications: Current Meds  Medication Sig   aspirin 81 MG chewable tablet Chew 81 mg by mouth daily.   iron polysaccharides (NIFEREX) 150 MG capsule Take 1 capsule (150 mg total) by mouth daily.     Allergies:   Caffeine, Red dye, and Yellow dyes (non-tartrazine)   Social History   Tobacco Use   Smoking status: Some Days    Types: Cigarettes   Smokeless tobacco: Never   Tobacco comments:    cigars  Vaping Use   Vaping Use: Never used  Substance Use Topics   Alcohol use: Not Currently   Drug use: Not Currently     Types: Marijuana, Other-see comments, MDMA (Ecstacy)    Comment: 11/24/21 took ecstacy; last used marijuana 11/25/21     Family History: The patient's family history includes Healthy in her father and mother.  ROS:   Please see the history of present illness.    All other systems reviewed and are negative.  EKGs/Labs/Other Studies Reviewed:    The following studies were reviewed today:  EKG:  NSR  Imaging studies that I have independently reviewed today: n/a  Recent Labs: 11/28/2021: Hemoglobin 11.2; Platelets 265  Recent Lipid Panel No results found for: "CHOL", "TRIG", "HDL", "CHOLHDL", "VLDL", "LDLCALC", "LDLDIRECT"  Physical Exam:    VS:  BP 120/79   Pulse 83   Ht 5\' 5"  (1.651 m)   Wt 167 lb 6.4 oz (75.9 kg)   LMP 08/04/2021   SpO2 97%   BMI 27.86 kg/m     Wt Readings from Last 5 Encounters:  03/29/22 167 lb 6.4 oz (75.9 kg)  02/06/21 225 lb 9.6 oz (102.3 kg)  10/17/18 115 lb (52.2 kg) (24 %, Z= -0.70)*  02/08/18 130 lb (59 kg) (57 %, Z= 0.17)*  01/09/17 125 lb 10.6 oz (57 kg) (54 %, Z= 0.10)*   * Growth percentiles are based on CDC (Girls, 2-20 Years) data.    Constitutional: No acute distress Eyes: sclera non-icteric, normal conjunctiva and lids ENMT: normal dentition, moist mucous membranes Cardiovascular: regular rhythm, normal rate, no murmur. S1 and S2  normal. No jugular venous distention.  Respiratory: clear to auscultation bilaterally GI : normal bowel sounds, soft and nontender. No distention.   MSK: extremities warm, well perfused. No edema.  NEURO: grossly nonfocal exam, moves all extremities. PSYCH: alert and oriented x 3, normal mood and affect.   ASSESSMENT:    1. Palpitations   2. Antepartum anemia   3. History of cesarean section   4. Current pregnancy determined by history    PLAN:    Palpitations - Plan: EKG 12-Lead, AMB Referral to Cardio Obstetrics, ECHOCARDIOGRAM COMPLETE Current pregnancy determined by history Anemia - she is  anemic which likely accounts for most of her symptoms. Recommended that she proceed to her OBGYN office to pick up script for iron supplementation. This will likely help significantly. Needs to review bleeding risk factors with OBGYN in setting of current pregnancy. No frank bleeding endorsed. -recommend echocardiogram to screen for structural or congenital heart disease to facilitate safe delivery.  - if symptoms do not improve with iron supplementation, will consider further evaluation of chest pain and SOB. Will also refer her to cardio-ob clinic.   Total time of encounter: 45 minutes total time of encounter, including 25 minutes spent in face-to-face patient care on the date of this encounter. This time includes coordination of care and counseling regarding above mentioned problem list. Remainder of non-face-to-face time involved reviewing chart documents/testing relevant to the patient encounter and documentation in the medical record. I have independently reviewed documentation from referring provider.   Weston Brass, MD, [facility]  [facility]   Shared Decision Making/Informed Consent:       Medication Adjustments/Labs and Tests Ordered: Current medicines are reviewed at length with the patient today.  Concerns regarding medicines are outlined above.   Orders Placed This Encounter  Procedures   AMB Referral to Cardio Obstetrics   EKG 12-Lead   ECHOCARDIOGRAM COMPLETE    No orders of the defined types were placed in this encounter.   Patient Instructions  Medication Instructions:  No Changes In Medications at this time.  *If you need a refill on your cardiac medications before your next appointment, please call your pharmacy*  Testing/Procedures: Your physician has requested that you have an echocardiogram. Echocardiography is a painless test that uses sound waves to create images of your heart. It provides your doctor with information about the size and shape of  your heart and how well your heart's chambers and valves are working. You may receive an ultrasound enhancing agent through an IV if needed to better visualize your heart during the echo.This procedure takes approximately one hour. There are no restrictions for this procedure. This will take place at the [address], [address].   Follow-Up: At [facility], you and your health needs are our priority.  As part of our continuing mission to provide you with exceptional heart care, we have created designated Provider Care Teams.  These Care Teams include your primary Cardiologist (physician) and Advanced Practice Providers (APPs -  Physician Assistants and Nurse Practitioners) who all work together to provide you with the care you need, when you need it.  Your next appointment:   1 month(s) RIGHT AFTER ECHO  The format for your next appointment:   In Person  Provider: Dr. Servando Salina

## 2022-03-29 NOTE — Patient Instructions (Addendum)
Medication Instructions:  No Changes In Medications at this time.  *If you need a refill on your cardiac medications before your next appointment, please call your pharmacy*  Testing/Procedures: Your physician has requested that you have an echocardiogram. Echocardiography is a painless test that uses sound waves to create images of your heart. It provides your doctor with information about the size and shape of your heart and how well your heart's chambers and valves are working. You may receive an ultrasound enhancing agent through an IV if needed to better visualize your heart during the echo.This procedure takes approximately one hour. There are no restrictions for this procedure. This will take place at the [address].   Follow-Up: At [facility], you and your health needs are our priority.  As part of our continuing mission to provide you with exceptional heart care, we have created designated Provider Care Teams.  These Care Teams include your primary Cardiologist (physician) and Advanced Practice Providers (APPs -  Physician Assistants and Nurse Practitioners) who all work together to provide you with the care you need, when you need it.  Your next appointment:   1 month(s) RIGHT AFTER ECHO  The format for your next appointment:   In Person  Provider: Dr. Servando Salina

## 2022-04-15 ENCOUNTER — Ambulatory Visit (HOSPITAL_COMMUNITY): Payer: Medicaid Other | Attending: Internal Medicine

## 2022-04-18 ENCOUNTER — Encounter (HOSPITAL_COMMUNITY): Payer: Self-pay | Admitting: Internal Medicine

## 2022-04-21 ENCOUNTER — Encounter: Payer: Self-pay | Admitting: Cardiology

## 2022-06-07 ENCOUNTER — Ambulatory Visit: Payer: Medicaid Other | Admitting: Cardiology

## 2022-06-09 ENCOUNTER — Ambulatory Visit: Payer: Medicaid Other | Admitting: Cardiology

## 2022-06-15 ENCOUNTER — Encounter: Payer: Self-pay | Admitting: Cardiology

## 2022-06-15 ENCOUNTER — Other Ambulatory Visit: Payer: Self-pay | Admitting: Obstetrics and Gynecology

## 2022-07-12 ENCOUNTER — Telehealth (HOSPITAL_COMMUNITY): Payer: Self-pay | Admitting: *Deleted

## 2022-07-12 ENCOUNTER — Encounter (HOSPITAL_COMMUNITY): Payer: Self-pay

## 2022-07-12 NOTE — Telephone Encounter (Signed)
Preadmission screen  

## 2022-07-12 NOTE — Patient Instructions (Signed)
Chardai Lansky  07/12/2022   Your procedure is scheduled on:  07/25/2022  Arrive at 0730 at [employer] [facility] on [address] at [facility]  and [employer]. You are invited to use the FREE valet parking or use the Visitor's parking deck.  Pick up the phone at the desk and dial [phone].  Call this number if you have problems the morning of surgery: [phone]  Remember:   Do not eat food:(After Midnight) Desps de medianoche.  Do not drink clear liquids: (After Midnight) Desps de medianoche.  Take these medicines the morning of surgery with A SIP OF WATER:  none   Do not wear jewelry, make-up or nail polish.  Do not wear lotions, powders, or perfumes. Do not wear deodorant.  Do not shave 48 hours prior to surgery.  Do not bring valuables to the hospital.  [facility] is not   responsible for any belongings or valuables brought to the hospital.  Contacts, dentures or bridgework may not be worn into surgery.  Leave suitcase in the car. After surgery it may be brought to your room.  For patients admitted to the hospital, checkout time is 11:00 AM the day of              discharge.      Please read over the following fact sheets that you were given:     Preparing for Surgery

## 2022-07-15 ENCOUNTER — Ambulatory Visit: Payer: Medicaid Other | Admitting: Cardiology

## 2022-07-18 ENCOUNTER — Encounter (HOSPITAL_COMMUNITY): Payer: Self-pay

## 2022-07-22 ENCOUNTER — Other Ambulatory Visit (HOSPITAL_COMMUNITY)
Admission: RE | Admit: 2022-07-22 | Discharge: 2022-07-22 | Disposition: A | Discharge status: Home / Self Care | Payer: Medicaid Other | Source: Ambulatory Visit | Attending: Obstetrics and Gynecology | Admitting: Obstetrics and Gynecology

## 2022-07-22 DIAGNOSIS — Z01812 Encounter for preprocedural laboratory examination: Secondary | ICD-10-CM | POA: Diagnosis not present

## 2022-07-22 HISTORY — DX: Encounter for other specified aftercare: Z51.89

## 2022-07-22 LAB — CBC
HCT: 24.7 % — ABNORMAL LOW (ref 36.0–46.0)
Hemoglobin: 7.1 g/dL — ABNORMAL LOW (ref 12.0–15.0)
MCH: 19.6 pg — ABNORMAL LOW (ref 26.0–34.0)
MCHC: 28.7 g/dL — ABNORMAL LOW (ref 30.0–36.0)
MCV: 68 fL — ABNORMAL LOW (ref 80.0–100.0)
Platelets: 261 10*3/uL (ref 150–400)
RBC: 3.63 MIL/uL — ABNORMAL LOW (ref 3.87–5.11)
RDW: 20.3 % — ABNORMAL HIGH (ref 11.5–15.5)
WBC: 5.8 10*3/uL (ref 4.0–10.5)
nRBC: 0.3 % — ABNORMAL HIGH (ref 0.0–0.2)

## 2022-07-22 LAB — BASIC METABOLIC PANEL
Anion gap: 8 (ref 5–15)
BUN: 5 mg/dL — ABNORMAL LOW (ref 6–20)
CO2: 22 mmol/L (ref 22–32)
Calcium: 8.4 mg/dL — ABNORMAL LOW (ref 8.9–10.3)
Chloride: 105 mmol/L (ref 98–111)
Creatinine, Ser: 0.59 mg/dL (ref 0.44–1.00)
GFR, Estimated: >60 mL/min (ref >60)
Glucose, Bld: 77 mg/dL (ref 70–99)
Potassium: 3.4 mmol/L — ABNORMAL LOW (ref 3.5–5.1)
Sodium: 135 mmol/L (ref 135–145)

## 2022-07-22 LAB — RPR: RPR Ser Ql: NONREACTIVE

## 2022-07-23 NOTE — Anesthesia Preprocedure Evaluation (Signed)
Anesthesia Evaluation  Patient identified by MRN, date of birth, ID band Patient awake    Reviewed: Allergy & Precautions, NPO status , Patient's Chart, lab work & pertinent test results  History of Anesthesia Complications Negative for: history of anesthetic complications  Airway Mallampati: III  TM Distance: >3 FB Neck ROM: Full    Dental no notable dental hx. (+) Dental Advisory Given   Pulmonary Current Smoker,    Pulmonary exam normal        Cardiovascular Hypertension: gestational. negative cardio ROS Normal cardiovascular exam     Neuro/Psych PSYCHIATRIC DISORDERS Depression  Oppositional defiant d/o negative neurological ROS     GI/Hepatic negative GI ROS, Neg liver ROS,   Endo/Other   Obesity   Renal/GU negative Renal ROS     Musculoskeletal negative musculoskeletal ROS (+)   Abdominal   Peds  (+) ADHD Hematology  (+) Blood dyscrasia, anemia ,   Anesthesia Other Findings    Reproductive/Obstetrics (+) Pregnancy                            Anesthesia Physical  Anesthesia Plan  ASA: 2  Anesthesia Plan: Spinal   Post-op Pain Management: Tylenol PO (pre-op)*   Induction:   PONV Risk Score and Plan: 2 and Treatment may vary due to age or medical condition, Ondansetron and Scopolamine patch - Pre-op  Airway Management Planned: Natural Airway  Additional Equipment: None  Intra-op Plan:   Post-operative Plan:   Informed Consent: I have reviewed the patients History and Physical, chart, labs and discussed the procedure including the risks, benefits and alternatives for the proposed anesthesia with the patient or authorized representative who has indicated his/her understanding and acceptance.     Dental advisory given  Plan Discussed with: Anesthesiologist and CRNA  Anesthesia Plan Comments:        Anesthesia Quick Evaluation

## 2022-07-25 ENCOUNTER — Inpatient Hospital Stay (HOSPITAL_COMMUNITY): Payer: Medicaid Other | Admitting: Anesthesiology

## 2022-07-25 ENCOUNTER — Encounter (HOSPITAL_COMMUNITY): Payer: Self-pay | Admitting: Obstetrics and Gynecology

## 2022-07-25 ENCOUNTER — Other Ambulatory Visit: Payer: Self-pay

## 2022-07-25 ENCOUNTER — Inpatient Hospital Stay (HOSPITAL_COMMUNITY)
Admission: RE | Admit: 2022-07-25 | Discharge: 2022-07-27 | DRG: 788 | Disposition: A | Discharge status: Home / Self Care | Payer: Medicaid Other | Attending: Obstetrics and Gynecology | Admitting: Obstetrics and Gynecology

## 2022-07-25 ENCOUNTER — Encounter (HOSPITAL_COMMUNITY)
Admission: RE | Disposition: A | Discharge status: Home / Self Care | Payer: Self-pay | Source: Home / Self Care | Attending: Obstetrics and Gynecology

## 2022-07-25 DIAGNOSIS — O34211 Maternal care for low transverse scar from previous cesarean delivery: Principal | ICD-10-CM | POA: Diagnosis present

## 2022-07-25 DIAGNOSIS — D649 Anemia, unspecified: Secondary | ICD-10-CM | POA: Diagnosis not present

## 2022-07-25 DIAGNOSIS — O99214 Obesity complicating childbirth: Secondary | ICD-10-CM | POA: Diagnosis present

## 2022-07-25 DIAGNOSIS — O134 Gestational [pregnancy-induced] hypertension without significant proteinuria, complicating childbirth: Secondary | ICD-10-CM | POA: Diagnosis not present

## 2022-07-25 DIAGNOSIS — O99334 Smoking (tobacco) complicating childbirth: Secondary | ICD-10-CM | POA: Diagnosis present

## 2022-07-25 DIAGNOSIS — O9902 Anemia complicating childbirth: Secondary | ICD-10-CM | POA: Diagnosis present

## 2022-07-25 DIAGNOSIS — Z3A39 39 weeks gestation of pregnancy: Secondary | ICD-10-CM

## 2022-07-25 DIAGNOSIS — F17210 Nicotine dependence, cigarettes, uncomplicated: Secondary | ICD-10-CM | POA: Diagnosis present

## 2022-07-25 DIAGNOSIS — Z98891 History of uterine scar from previous surgery: Secondary | ICD-10-CM

## 2022-07-25 LAB — PREPARE RBC (CROSSMATCH)

## 2022-07-25 LAB — GROUP B STREP BY PCR: Group B strep by PCR: NEGATIVE

## 2022-07-25 SURGERY — Surgical Case
Anesthesia: Spinal

## 2022-07-25 MED ORDER — OXYTOCIN-SODIUM CHLORIDE 30-0.9 UT/500ML-% IV SOLN
2.5000 [IU]/h | INTRAVENOUS | Status: AC
  Administered 2022-07-25: 2.5 [IU]/h via INTRAVENOUS
  Filled 2022-07-25: qty 500

## 2022-07-25 MED ORDER — SCOPOLAMINE 1 MG/3DAYS TD PT72
MEDICATED_PATCH | TRANSDERMAL | Status: AC
  Filled 2022-07-25: qty 1

## 2022-07-25 MED ORDER — TETANUS-DIPHTH-ACELL PERTUSSIS 5-2.5-18.5 LF-MCG/0.5 IM SUSY: 0.5000 mL | PREFILLED_SYRINGE | Freq: Once | INTRAMUSCULAR | Status: DC

## 2022-07-25 MED ORDER — SENNOSIDES-DOCUSATE SODIUM 8.6-50 MG PO TABS
2.0000 | ORAL_TABLET | Freq: Every day | ORAL | Status: DC
  Administered 2022-07-26 – 2022-07-27 (×2): 2 via ORAL
  Filled 2022-07-25 (×2): qty 2

## 2022-07-25 MED ORDER — LACTATED RINGERS IV SOLN: INTRAVENOUS | Status: DC

## 2022-07-25 MED ORDER — DEXMEDETOMIDINE HCL IN NACL 80 MCG/20ML IV SOLN
INTRAVENOUS | Status: DC | PRN
  Administered 2022-07-25 (×2): 1 mL via BUCCAL

## 2022-07-25 MED ORDER — MISOPROSTOL 200 MCG PO TABS
800.0000 ug | ORAL_TABLET | Freq: Once | ORAL | Status: AC
  Administered 2022-07-25: 800 ug via RECTAL

## 2022-07-25 MED ORDER — DIPHENHYDRAMINE HCL 25 MG PO CAPS: 25.0000 mg | ORAL_CAPSULE | ORAL | Status: DC | PRN

## 2022-07-25 MED ORDER — TRANEXAMIC ACID-NACL 1000-0.7 MG/100ML-% IV SOLN
1000.0000 mg | Freq: Once | INTRAVENOUS | Status: AC
  Administered 2022-07-25: 1000 mg via INTRAVENOUS

## 2022-07-25 MED ORDER — DIPHENHYDRAMINE HCL 25 MG PO CAPS: 25.0000 mg | ORAL_CAPSULE | Freq: Four times a day (QID) | ORAL | Status: DC | PRN

## 2022-07-25 MED ORDER — ACETAMINOPHEN 500 MG PO TABS
1000.0000 mg | ORAL_TABLET | Freq: Four times a day (QID) | ORAL | Status: DC
  Administered 2022-07-25 – 2022-07-27 (×8): 1000 mg via ORAL
  Filled 2022-07-25 (×9): qty 2

## 2022-07-25 MED ORDER — TRANEXAMIC ACID-NACL 1000-0.7 MG/100ML-% IV SOLN
INTRAVENOUS | Status: AC
  Filled 2022-07-25: qty 100

## 2022-07-25 MED ORDER — ACETAMINOPHEN 500 MG PO TABS: 1000.0000 mg | ORAL_TABLET | Freq: Once | ORAL | Status: DC

## 2022-07-25 MED ORDER — OXYTOCIN-SODIUM CHLORIDE 30-0.9 UT/500ML-% IV SOLN
INTRAVENOUS | Status: DC | PRN
  Administered 2022-07-25: 250 mL via INTRAVENOUS

## 2022-07-25 MED ORDER — COCONUT OIL OIL: 1.0000 | TOPICAL_OIL | Status: DC | PRN

## 2022-07-25 MED ORDER — PHENYLEPHRINE HCL-NACL 20-0.9 MG/250ML-% IV SOLN
INTRAVENOUS | Status: DC | PRN
  Administered 2022-07-25: 60 ug/min via INTRAVENOUS

## 2022-07-25 MED ORDER — PHENYLEPHRINE HCL-NACL 20-0.9 MG/250ML-% IV SOLN
INTRAVENOUS | Status: AC
  Filled 2022-07-25: qty 250

## 2022-07-25 MED ORDER — SOD CITRATE-CITRIC ACID 500-334 MG/5ML PO SOLN: 30.0000 mL | Freq: Once | ORAL | Status: DC

## 2022-07-25 MED ORDER — DIPHENHYDRAMINE HCL 50 MG/ML IJ SOLN
12.5000 mg | INTRAMUSCULAR | Status: DC | PRN
  Administered 2022-07-25: 12.5 mg via INTRAVENOUS
  Filled 2022-07-25: qty 1

## 2022-07-25 MED ORDER — SODIUM CHLORIDE 0.9 % IR SOLN
Status: DC | PRN
  Administered 2022-07-25: 1

## 2022-07-25 MED ORDER — PROMETHAZINE HCL 25 MG/ML IJ SOLN: 6.2500 mg | INTRAMUSCULAR | Status: DC | PRN

## 2022-07-25 MED ORDER — OXYTOCIN-SODIUM CHLORIDE 30-0.9 UT/500ML-% IV SOLN: INTRAVENOUS | Status: DC | PRN

## 2022-07-25 MED ORDER — NALOXONE HCL 0.4 MG/ML IJ SOLN: 0.4000 mg | INTRAMUSCULAR | Status: DC | PRN

## 2022-07-25 MED ORDER — ZOLPIDEM TARTRATE 5 MG PO TABS: 5.0000 mg | ORAL_TABLET | Freq: Every evening | ORAL | Status: DC | PRN

## 2022-07-25 MED ORDER — FENTANYL CITRATE (PF) 100 MCG/2ML IJ SOLN: 25.0000 ug | INTRAMUSCULAR | Status: DC | PRN

## 2022-07-25 MED ORDER — SCOPOLAMINE 1 MG/3DAYS TD PT72: 1.0000 | MEDICATED_PATCH | Freq: Once | TRANSDERMAL | Status: DC

## 2022-07-25 MED ORDER — MISOPROSTOL 200 MCG PO TABS
ORAL_TABLET | ORAL | Status: AC
  Filled 2022-07-25: qty 4

## 2022-07-25 MED ORDER — SODIUM CHLORIDE 0.9% FLUSH: 3.0000 mL | INTRAVENOUS | Status: DC | PRN

## 2022-07-25 MED ORDER — CEFAZOLIN SODIUM-DEXTROSE 2-4 GM/100ML-% IV SOLN
2.0000 g | INTRAVENOUS | Status: AC
  Administered 2022-07-25: 2 g via INTRAVENOUS

## 2022-07-25 MED ORDER — POVIDONE-IODINE 10 % EX SWAB
2.0000 | Freq: Once | CUTANEOUS | Status: AC
  Administered 2022-07-25: 2 via TOPICAL

## 2022-07-25 MED ORDER — MORPHINE SULFATE (PF) 0.5 MG/ML IJ SOLN
INTRAMUSCULAR | Status: AC
  Filled 2022-07-25: qty 10

## 2022-07-25 MED ORDER — FENTANYL CITRATE (PF) 100 MCG/2ML IJ SOLN
INTRAMUSCULAR | Status: DC | PRN
  Administered 2022-07-25: 15 ug via INTRATHECAL

## 2022-07-25 MED ORDER — AMISULPRIDE (ANTIEMETIC) 5 MG/2ML IV SOLN: 10.0000 mg | Freq: Once | INTRAVENOUS | Status: DC | PRN

## 2022-07-25 MED ORDER — MEPERIDINE HCL 25 MG/ML IJ SOLN: 6.2500 mg | INTRAMUSCULAR | Status: DC | PRN

## 2022-07-25 MED ORDER — DEXAMETHASONE SODIUM PHOSPHATE 4 MG/ML IJ SOLN
INTRAMUSCULAR | Status: AC
  Filled 2022-07-25: qty 1

## 2022-07-25 MED ORDER — WITCH HAZEL-GLYCERIN EX PADS: 1.0000 | MEDICATED_PAD | CUTANEOUS | Status: DC | PRN

## 2022-07-25 MED ORDER — SCOPOLAMINE 1 MG/3DAYS TD PT72
1.0000 | MEDICATED_PATCH | TRANSDERMAL | Status: DC
  Administered 2022-07-25: 1.5 mg via TRANSDERMAL

## 2022-07-25 MED ORDER — FENTANYL CITRATE (PF) 100 MCG/2ML IJ SOLN
INTRAMUSCULAR | Status: AC
  Filled 2022-07-25: qty 2

## 2022-07-25 MED ORDER — CEFAZOLIN SODIUM-DEXTROSE 2-4 GM/100ML-% IV SOLN
INTRAVENOUS | Status: AC
  Filled 2022-07-25: qty 100

## 2022-07-25 MED ORDER — MENTHOL 3 MG MT LOZG: 1.0000 | LOZENGE | OROMUCOSAL | Status: DC | PRN

## 2022-07-25 MED ORDER — SCOPOLAMINE 1 MG/3DAYS TD PT72: 1.0000 | MEDICATED_PATCH | TRANSDERMAL | Status: DC

## 2022-07-25 MED ORDER — NALOXONE HCL 4 MG/10ML IJ SOLN: 1.0000 ug/kg/h | INTRAVENOUS | Status: DC | PRN

## 2022-07-25 MED ORDER — MORPHINE SULFATE (PF) 0.5 MG/ML IJ SOLN
INTRAMUSCULAR | Status: DC | PRN
  Administered 2022-07-25: 150 ug via INTRATHECAL

## 2022-07-25 MED ORDER — DEXAMETHASONE SODIUM PHOSPHATE 4 MG/ML IJ SOLN
INTRAMUSCULAR | Status: DC | PRN
  Administered 2022-07-25: 4 mg via INTRAVENOUS

## 2022-07-25 MED ORDER — IBUPROFEN 600 MG PO TABS
600.0000 mg | ORAL_TABLET | Freq: Four times a day (QID) | ORAL | Status: DC
  Administered 2022-07-26 – 2022-07-27 (×4): 600 mg via ORAL
  Filled 2022-07-25 (×4): qty 1

## 2022-07-25 MED ORDER — OXYCODONE HCL 5 MG PO TABS
5.0000 mg | ORAL_TABLET | ORAL | Status: DC | PRN
  Administered 2022-07-26 (×2): 10 mg via ORAL
  Administered 2022-07-27: 5 mg via ORAL
  Administered 2022-07-27: 10 mg via ORAL
  Filled 2022-07-25: qty 1
  Filled 2022-07-25 (×3): qty 2

## 2022-07-25 MED ORDER — DIBUCAINE (PERIANAL) 1 % EX OINT: 1.0000 | TOPICAL_OINTMENT | CUTANEOUS | Status: DC | PRN

## 2022-07-25 MED ORDER — VALACYCLOVIR HCL 500 MG PO TABS: 500.0000 mg | ORAL_TABLET | Freq: Every day | ORAL | Status: DC | PRN

## 2022-07-25 MED ORDER — SOD CITRATE-CITRIC ACID 500-334 MG/5ML PO SOLN
ORAL | Status: AC
  Filled 2022-07-25: qty 30

## 2022-07-25 MED ORDER — KETOROLAC TROMETHAMINE 30 MG/ML IJ SOLN
30.0000 mg | Freq: Four times a day (QID) | INTRAMUSCULAR | Status: AC
  Administered 2022-07-25 – 2022-07-26 (×4): 30 mg via INTRAVENOUS
  Filled 2022-07-25 (×4): qty 1

## 2022-07-25 MED ORDER — ONDANSETRON HCL 4 MG/2ML IJ SOLN
INTRAMUSCULAR | Status: DC | PRN
  Administered 2022-07-25: 4 mg via INTRAVENOUS

## 2022-07-25 MED ORDER — INFLUENZA VAC SPLIT QUAD 0.5 ML IM SUSY: 0.5000 mL | PREFILLED_SYRINGE | INTRAMUSCULAR | Status: DC

## 2022-07-25 MED ORDER — SODIUM CHLORIDE 0.9% IV SOLUTION: Freq: Once | INTRAVENOUS | Status: DC

## 2022-07-25 MED ORDER — SIMETHICONE 80 MG PO CHEW: 80.0000 mg | CHEWABLE_TABLET | ORAL | Status: DC | PRN

## 2022-07-25 MED ORDER — BUPIVACAINE IN DEXTROSE 0.75-8.25 % IT SOLN
INTRATHECAL | Status: DC | PRN
  Administered 2022-07-25: 1.6 mL via INTRATHECAL

## 2022-07-25 MED ORDER — PRENATAL MULTIVITAMIN CH
1.0000 | ORAL_TABLET | Freq: Every day | ORAL | Status: DC
  Administered 2022-07-27: 1 via ORAL
  Filled 2022-07-25: qty 1

## 2022-07-25 MED ORDER — DEXMEDETOMIDINE HCL IN NACL 80 MCG/20ML IV SOLN
INTRAVENOUS | Status: AC
  Filled 2022-07-25: qty 40

## 2022-07-25 MED ORDER — TRANEXAMIC ACID-NACL 1000-0.7 MG/100ML-% IV SOLN
INTRAVENOUS | Status: DC | PRN
  Administered 2022-07-25: 1000 mg via INTRAVENOUS

## 2022-07-25 MED ORDER — ONDANSETRON HCL 4 MG/2ML IJ SOLN: 4.0000 mg | Freq: Three times a day (TID) | INTRAMUSCULAR | Status: DC | PRN

## 2022-07-25 MED ORDER — SIMETHICONE 80 MG PO CHEW
80.0000 mg | CHEWABLE_TABLET | Freq: Three times a day (TID) | ORAL | Status: DC
  Administered 2022-07-25 – 2022-07-27 (×6): 80 mg via ORAL
  Filled 2022-07-25 (×6): qty 1

## 2022-07-25 SURGICAL SUPPLY — 37 items
BENZOIN TINCTURE PRP APPL 2/3 (GAUZE/BANDAGES/DRESSINGS) ×2 IMPLANT
CHLORAPREP W/TINT 26ML (MISCELLANEOUS) ×4 IMPLANT
CLAMP CORD UMBIL (MISCELLANEOUS) ×2 IMPLANT
CLOTH BEACON ORANGE TIMEOUT ST (SAFETY) ×2 IMPLANT
DRSG OPSITE POSTOP 4X10 (GAUZE/BANDAGES/DRESSINGS) ×2 IMPLANT
ELECT REM PT RETURN 9FT ADLT (ELECTROSURGICAL) ×2
ELECTRODE REM PT RTRN 9FT ADLT (ELECTROSURGICAL) ×2 IMPLANT
EXTRACTOR VACUUM M CUP 4 TUBE (SUCTIONS) IMPLANT
GAUZE SPONGE 4X4 12PLY STRL LF (GAUZE/BANDAGES/DRESSINGS) IMPLANT
GLOVE BIO SURGEON STRL SZ7.5 (GLOVE) ×2 IMPLANT
GLOVE BIOGEL PI IND STRL 7.0 (GLOVE) ×2 IMPLANT
GLOVE BIOGEL PI IND STRL 7.5 (GLOVE) ×2 IMPLANT
GOWN STRL REUS W/TWL LRG LVL3 (GOWN DISPOSABLE) ×4 IMPLANT
HEMOSTAT ARISTA ABSORB 3G PWDR (HEMOSTASIS) IMPLANT
KIT ABG SYR 3ML LUER SLIP (SYRINGE) IMPLANT
MAT PREVALON FULL STRYKER (MISCELLANEOUS) IMPLANT
NDL HYPO 25X5/8 SAFETYGLIDE (NEEDLE) IMPLANT
NEEDLE HYPO 25X5/8 SAFETYGLIDE (NEEDLE) IMPLANT
NS IRRIG 1000ML POUR BTL (IV SOLUTION) ×2 IMPLANT
PACK C SECTION WH (CUSTOM PROCEDURE TRAY) ×2 IMPLANT
PAD ABD 7.5X8 STRL (GAUZE/BANDAGES/DRESSINGS) IMPLANT
PAD OB MATERNITY 4.3X12.25 (PERSONAL CARE ITEMS) ×2 IMPLANT
RTRCTR C-SECT PINK 25CM LRG (MISCELLANEOUS) ×2 IMPLANT
STRIP CLOSURE SKIN 1/2X4 (GAUZE/BANDAGES/DRESSINGS) ×2 IMPLANT
SUT CHROMIC 2 0 CT 1 (SUTURE) ×2 IMPLANT
SUT MNCRL 0 VIOLET CTX 36 (SUTURE) IMPLANT
SUT MNCRL AB 3-0 PS2 27 (SUTURE) ×2 IMPLANT
SUT MONOCRYL 0 CTX 36 (SUTURE) ×2
SUT PLAIN 2 0 XLH (SUTURE) ×2 IMPLANT
SUT VIC AB 0 CT1 36 (SUTURE) ×2 IMPLANT
SUT VIC AB 0 CTX 36 (SUTURE) ×6
SUT VIC AB 0 CTX36XBRD ANBCTRL (SUTURE) ×6 IMPLANT
SUT VIC AB 2-0 SH 27 (SUTURE)
SUT VIC AB 2-0 SH 27XBRD (SUTURE) IMPLANT
TOWEL OR 17X24 6PK STRL BLUE (TOWEL DISPOSABLE) ×2 IMPLANT
TRAY FOLEY W/BAG SLVR 14FR LF (SET/KITS/TRAYS/PACK) ×2 IMPLANT
WATER STERILE IRR 1000ML POUR (IV SOLUTION) ×2 IMPLANT

## 2022-07-25 NOTE — Anesthesia Postprocedure Evaluation (Signed)
Anesthesia Post Note  Patient: Annette Archer  Procedure(s) Performed: CESAREAN SECTION APPLICATION OF CELL SAVER     Patient location during evaluation: PACU Anesthesia Type: Spinal Level of consciousness: awake and alert Pain management: pain level controlled Vital Signs Assessment: post-procedure vital signs reviewed and stable Respiratory status: spontaneous breathing and respiratory function stable Cardiovascular status: blood pressure returned to baseline and stable Postop Assessment: spinal receding Anesthetic complications: no   No notable events documented.  Last Vitals:  Vitals:   07/25/22 1300 07/25/22 1315  BP: 105/60 105/75  Pulse: (!) 57 61  Resp: 17 18  Temp:    SpO2: 93% 97%    Last Pain:  Vitals:   07/25/22 1315  TempSrc:   PainSc: 0-No pain   Pain Goal: Patients Stated Pain Goal: 6 (07/25/22 1215)  LLE Motor Response: Non-purposeful movement (07/25/22 1300) LLE Sensation: Tingling (07/25/22 1300) RLE Motor Response: Non-purposeful movement (07/25/22 1300) RLE Sensation: Tingling (07/25/22 1300) L Sensory Level: L3-Anterior knee, lower leg (07/25/22 1300) R Sensory Level: L3-Anterior knee, lower leg (07/25/22 1300) Epidural/Spinal Function Cutaneous sensation: Able to Discern Pressure (07/25/22 1300), Patient able to flex knees: Yes (07/25/22 1300), Patient able to lift hips off bed: No (07/25/22 1300), Back pain beyond tenderness at insertion site: No (07/25/22 1300), Progressively worsening motor and/or sensory loss: No (07/25/22 1300), Bowel and/or bladder incontinence post epidural: No (07/25/22 1300)  [place]

## 2022-07-25 NOTE — Transfer of Care (Signed)
Immediate Anesthesia Transfer of Care Note  Patient: Annette Archer  Procedure(s) Performed: CESAREAN SECTION APPLICATION OF CELL SAVER  Patient Location: PACU  Anesthesia Type:Spinal  Level of Consciousness: awake, alert  and oriented  Airway & Oxygen Therapy: Patient Spontanous Breathing  Post-op Assessment: Report given to RN and Post -op Vital signs reviewed and stable  Post vital signs: Reviewed and stable  Last Vitals:  Vitals Value Taken Time  BP 106/44 07/25/22 1200  Temp    Pulse    Resp 18 07/25/22 1203  SpO2    Vitals shown include unvalidated device data.  Last Pain:  Vitals:   07/25/22 0839  TempSrc: Oral         Complications: No notable events documented.

## 2022-07-25 NOTE — Anesthesia Procedure Notes (Signed)
Spinal  Patient location during procedure: OR Start time: 07/25/2022 10:19 AM End time: 07/25/2022 10:28 AM Reason for block: surgical anesthesia Staffing Performed: anesthesiologist  Anesthesiologist: Duane Boston, MD Performed by: Duane Boston, MD Authorized by: Duane Boston, MD   Preanesthetic Checklist Completed: patient identified, IV checked, risks and benefits discussed, surgical consent, monitors and equipment checked, pre-op evaluation and timeout performed Spinal Block Patient position: sitting Prep: DuraPrep Patient monitoring: cardiac monitor, continuous pulse ox and blood pressure Approach: midline Location: L2-3 Injection technique: single-shot Needle Needle type: Pencan  Needle gauge: 24 G Needle length: 9 cm Assessment Events: CSF return Additional Notes Functioning IV was confirmed and monitors were applied. Sterile prep and drape, including hand hygiene and sterile gloves were used. The patient was positioned and the spine was prepped. The skin was anesthetized with lidocaine.  Free flow of clear CSF was obtained prior to injecting local anesthetic into the CSF.  The spinal needle aspirated freely following injection.  The needle was carefully withdrawn.  The patient tolerated the procedure well.

## 2022-07-25 NOTE — H&P (Signed)
Annette Archer is a 21 y.o. female presenting for repeat c-section. OB History     Gravida  2   Para  1   Term  1   Preterm  0   AB  0   Living  1      SAB  0   IAB  0   Ectopic  0   Multiple  0   Live Births  1          Past Medical History:  Diagnosis Date   ADHD (attention deficit hyperactivity disorder)    Anemia    Blood transfusion without reported diagnosis    Depression    ODD (oppositional defiant disorder)    Past Surgical History:  Procedure Laterality Date   CESAREAN SECTION  02/07/2021   Procedure: CESAREAN SECTION;  Surgeon: Annette Dallas, DO;  Location: [facility] LD ORS;  Service: Obstetrics;;   NO PAST SURGERIES     Family History: family history includes Healthy in her father and mother. Social History:  reports that she has been smoking cigarettes. She has never used smokeless tobacco. She reports that she does not currently use alcohol. She reports that she does not currently use drugs after having used the following drugs: Marijuana, Other-see comments, and MDMA (Ecstacy).     Maternal Diabetes: unknown pt non-compliant with testing Genetic Screening: Normal Maternal Ultrasounds/Referrals: Normal Fetal Ultrasounds or other Referrals:  None Maternal Substance Abuse:  No Significant Maternal Medications:  Meds include: Other: 81mg  aspirin, valacyclovir and vitamin D Significant Maternal Lab Results:  Other: pt missed 36wk appt and did not have GBS done.  Will do GBS by PCR at c/s. Number of Prenatal Visits:greater than 3 verified prenatal visits Other Comments:  seen by ob cardiologist d/t cardiac sxs early on in pregnancy ultimately thought to be d/t anemia.  Pt was scheduled for an echo but I do not see documentation of it being done nor the results.  Review of Systems Denies F/C/N/V/D History   Last menstrual period 08/04/2021, not currently breastfeeding. Exam Physical Exam  Lungs CTA  CV RRR Abdomen gravid, NT Extremities no calf  tenderness  Prenatal labs: ABO, Rh: --/--/A POS (09/15 4098) Antibody: NEG (09/15 0947) Rubella: Immune (02/14 0000) RPR: NON REACTIVE (09/15 0946)  HBsAg: Negative (02/14 0000)  HIV: Non-reactive (02/14 0000)  GBS:   to be collected at c/s  Assessment/Plan: 21yo G2P1001 at 39wks being admitted for scheduled repeat c-section.  Risks benefits alternatives discussed with patient including but not limited to bleeding infection and injury.  Questions answered and consent signed and witnessed.  GBS to be collected at time of c/s.  Pt has h/o blood transfusion with last c/s d/t anemia and a h/o GHTN in prior pregnancy.  Plan to cross pt for 2u PRBCs.  FHTs positive this morning.  Annette Archer 07/25/2022, 8:27 AM

## 2022-07-25 NOTE — Op Note (Signed)
Cesarean Section Procedure Note  Indications: P1 at 39wks admitted for repeat cesarean section.  Pre-operative Diagnosis: 1.39wks 2.REPEAT CESAREAN SECTION   Post-operative Diagnosis: 1.39wks 2.REPEAT CESAREAN SECTION  Procedure: REPEAT LOW TRANSVERSE CESAREAN SECTION  Surgeon: Osborn Coho, MD    Assistants: Dr. Christianne Dolin, OB Fellow  Anesthesia: Regional  Anesthesiologist: No responsible provider has been recorded for the case.   Procedure Details  The patient was taken to the operating room secondary to h/o prior cesarean section after the risks, benefits, complications, treatment options, and expected outcomes were discussed with the patient.  The patient concurred with the proposed plan, giving informed consent which was signed and witnessed. The patient was taken to Operating Room C, identified as Annette Archer and the procedure verified as C-Section Delivery. A Time Out was held and the above information confirmed.  After induction of anesthesia by obtaining a spinal, the patient was prepped and draped in the usual sterile manner. A Pfannenstiel skin incision was made and carried down through the subcutaneous tissue to the underlying layer of fascia.  The fascia was incised bilaterally and extended transversely bilaterally with the Mayo scissors. Kocher clamps were placed on the inferior aspect of the fascial incision and the underlying rectus muscle was separated from the fascia. The same was done on the superior aspect of the fascial incision.  The peritoneum was identified, entered bluntly and extended manually.  An Alexis self-retaining retractor was placed.  The utero-vesical peritoneal reflection was incised transversely and the bladder flap was bluntly freed from the lower uterine segment. A low transverse uterine incision was made with the scalpel and extended bilaterally with the bandage scissors.  The infant was delivered in vertex position (LOP) without difficulty.   After the umbilical cord was clamped and cut, the infant was handed to the awaiting pediatricians.  Cord blood was obtained for evaluation.  The placenta was removed intact and appeared to be within normal limits. The uterus was cleared of all clots and debris. The uterine incision was closed with running interlocking sutures of 0 Vicryl and a second imbricating layer was performed as well.   Bilateral tubes and ovaries appeared to be within normal limits.  Good hemostasis was noted.  Copious irrigation was performed until clear.  The peritoneum was repaired with 2-0 chromic via a running suture.  The fascia was reapproximated with a running suture of 0 Vicryl.  The skin was reapproximated with a subcuticular suture of 3-0 monocryl.  Steristrips were applied.  Instrument, sponge, and needle counts were correct prior to abdominal closure and at the conclusion of the case.  The patient was awaiting transfer to the recovery room in good condition.  Findings: Live female infant with Apgars 8 at one minute and 9 at five minutes.  Normal appearing bilateral ovaries and fallopian tubes were noted.  Estimated Blood Loss:  340 ml         Drains: foley to gravity 100 cc clear urine         Total IV Fluids: 1300 ml         Specimens to Pathology: None         Complications:  None; patient tolerated the procedure well.         Disposition: PACU - hemodynamically stable.         Condition: stable  Attending Attestation: I performed the procedure.  I was present and scrubbed and the assistant was required due to complexity of anatomy.  An experienced assistant was  required given the standard of surgical care given the complexity of the case.  This assistant was needed for exposure, dissection, suctioning, retraction, instrument exchange, assisting with delivery with administration of fundal pressure, and for overall help during the procedure.

## 2022-07-26 LAB — CBC
HCT: 21.6 % — ABNORMAL LOW (ref 36.0–46.0)
Hemoglobin: 6.1 g/dL — CL (ref 12.0–15.0)
MCH: 19.1 pg — ABNORMAL LOW (ref 26.0–34.0)
MCHC: 28.2 g/dL — ABNORMAL LOW (ref 30.0–36.0)
MCV: 67.5 fL — ABNORMAL LOW (ref 80.0–100.0)
Platelets: 241 10*3/uL (ref 150–400)
RBC: 3.2 MIL/uL — ABNORMAL LOW (ref 3.87–5.11)
RDW: 20.8 % — ABNORMAL HIGH (ref 11.5–15.5)
WBC: 10.8 10*3/uL — ABNORMAL HIGH (ref 4.0–10.5)
nRBC: 0.3 % — ABNORMAL HIGH (ref 0.0–0.2)

## 2022-07-26 LAB — BIRTH TISSUE RECOVERY COLLECTION (PLACENTA DONATION)

## 2022-07-26 MED ORDER — SODIUM CHLORIDE 0.9 % IV SOLN
500.0000 mg | Freq: Once | INTRAVENOUS | Status: AC
  Administered 2022-07-26: 500 mg via INTRAVENOUS
  Filled 2022-07-26: qty 25

## 2022-07-26 NOTE — Progress Notes (Signed)
Date and time results received: 07/26/22 0730 (use smartphrase ".now" to insert current time)  Test: Hemoglobin Critical Value: 6.1  Name of Provider Notified: Dr Charlesetta Garibaldi  Orders Received? Or Actions Taken?: Orders Received - See Orders for details

## 2022-07-26 NOTE — Progress Notes (Signed)
Post Partum Day 1 Subjective: no complaints, up ad lib, tolerating PO, and + flatus  Objective: Blood pressure 116/71, pulse 86, temperature 98.4 F (36.9 C), temperature source Oral, resp. rate 16, height 5\' 4"  (1.626 m), weight 95.3 kg, last menstrual period 08/04/2021, SpO2 100 %, currently breastfeeding.  Physical Exam:  General: alert and cooperative Lochia: appropriate Uterine Fundus: firm Incision: healing well, no significant drainage DVT Evaluation: Negative Homan's sign.  Recent Labs    07/26/22 0626  HGB 6.1*  HCT 21.6*    Assessment/Plan: Breastfeeding Plans for condoms for [facility] IV venofer for anemia Possible discharge tomorrow   LOS: 1 day   Betsy Coder, MD 07/26/2022, 5:03 PM

## 2022-07-26 NOTE — Clinical Social Work Maternal (Signed)
CLINICAL SOCIAL WORK MATERNAL/CHILD NOTE  Patient Details  Name: Annette Archer MRN: [id number] Date of Birth: 02/02/2001  Date:  07/26/2022  Clinical Social Worker Initiating Note:  Raissa Dam, LCSW Date/Time: Initiated:  07/26/22/      Child's Name:  Annette Archer   Biological Parents:  Mother, Father  (MOB: Annette Archer 08/13/1999, Annette Archer 07/04/2002)  Need for Interpreter:  None   Reason for Referral:  Behavioral Health Concerns, Current Substance Use/Substance Use During Pregnancy     Address:  [address] [place] [address]    Phone number:  [phone] (home)     Additional phone number:   Household Members/Support Persons (HM/SP):   Household Member/Support Person 1, Household Member/Support Person 2   HM/SP Name Relationship DOB or Age  HM/SP -1 Annette Archer Daughter 02-07-2021  HM/SP -2 Annette Archer Grandmother 01-02-1963  HM/SP -3        HM/SP -4        HM/SP -5        HM/SP -6        HM/SP -7        HM/SP -8          Natural Supports (not living in the home):  Spouse/significant other   Professional Supports: None   Employment: Unemployed   Type of Work:     Education:  9 to 11 years   Homebound arranged: No  Financial Resources:  Medicaid   Other Resources:  WIC, Food Stamps     Cultural/Religious Considerations Which May Impact Care:    Strengths:  Ability to meet basic needs  , Home prepared for child  , Pediatrician chosen   Psychotropic Medications:         Pediatrician:    [place] area  Pediatrician List:   [facility]  [place]    [place]    [place]    [place]    [place]      Pediatrician Fax Number:    Risk Factors/Current Problems:      Cognitive State:  Able to Concentrate  , Insightful  , Alert  , Linear Thinking     Mood/Affect:  Comfortable  , Calm  , Interested     CSW Assessment: CSW received consult for Anx/depression/  oppositional def. Disorder,+UDS for Amphetamines and THC Jan 2023. CSW met with MOB to offer support and complete assessment.    CSW met with MOB at bedside and introduced CSW role. CSW observed MOB in bed holding the infant. MOB presented calm and engaged with CSW during the visit. MOB confirmed the demographic information on hospital file. MOB reported she lives with her grandmother and daughter (see chart above). MOB reported FOB " Annette Archer (07-05-2002) is a primary support and lives outside the home. She is unemployed and receives WIC/FS.   CSW inquired about MOB illicit substance use documented in the OB record in January during the pregnancy. CSW notified MOB at the time her UDS was positive for THC and amphetamines. MOB stated she smoked marijuana prior to learning about the pregnancy and stopped when she found out about the pregnancy. MOB reported she has an active prescription for Adderall through [facility] and takes it PRN. The last time she took Adderall was in December prior to taking a placement test at [facility] to help her focus. MOB denied using ecstasy. CSW informed MOB about the hospital drug screen policy. MOB made aware that CSW will monitor the   infant's UDS/CDS and make a report to CPS, if warranted. MOB reported understanding. MOB denied prior to CPS involvement.   CSW inquired about MOB mental health history. MOB acknowledged she has history of anxiety, depression and oppositional defiant disorder that was diagnosed at [age] while under services at [facility]. She took medication and saw a therapist for treatment. MOB reported she manages her anxiety and depression well and has not needed mental health services. MOB reported she does have access to [facility] services if needed. CSW discussed PPA/PPD. MOB stated that she is knowledgeable of PPA/PPD and did not experience PPD with her older children. MOB stated the at she feels "confident if I need help and will ask for it." CSW assessed MOB  for safety. MOB denied thoughts of harm to self and others. CSW provided education regarding the baby blues period vs. perinatal mood disorders, discussed treatment and gave resources for mental health follow up if concerns arise.  CSW recommended MOB complete a self-evaluation during the postpartum time period using the New Mom Checklist from Postpartum Progress and encouraged MOB to contact a medical professional if symptoms are noted at any time.    MOB reported she has all items for the infant including a pack n play where the infant will sleep. CSW provided review of Sudden Infant Death Syndrome (SIDS) precautions.   OB has chosen [facility] for the infant's follow up care. CSW assessed MOB for additional needs. MOB reported no further need.   CSW identifies no further need for intervention and no barriers to discharge at this time.    CSW Will Continue to Monitor Umbilical Cord Tissue Drug Screen Results and Make Report if Warranted,   CSW Plan/Description:  Sudden Infant Death Syndrome (SIDS) Education, CSW Will Continue to Monitor Umbilical Cord Tissue Drug Screen Results and Make Report if Warranted, Hospital Drug Screen Policy Information, Perinatal Mood and Anxiety Disorder (PMADs) Education, No Further Intervention Required/No Barriers to Discharge    Evgenia Merriman A Akai Dollard, LCSW 07/26/2022, 12:44PM 

## 2022-07-26 NOTE — Lactation Note (Signed)
This note was copied from a baby's chart. Lactation Consultation Note  Patient Name: Girl Skylor Hughson Today's Date: 07/26/2022 Reason for consult: Initial assessment Age:23 hours  P2, Baby is being breastfed and formula fed. Discussed feeding frequency.  Recommend offering the breast before formula to help establish mother's milk supply. Feed on demand with cues.  Goal 8-12+ times per day after first 24 hrs.  Place baby STS if not cueing. `12 Mom made aware of O/P services, breastfeeding support groups, community resources, and our phone # for post-discharge questions.    Maternal Data Has patient been taught Hand Expression?: Yes Does the patient have breastfeeding experience prior to this delivery?: Yes How long did the patient breastfeed?: 4 mos.  Feeding Mother's Current Feeding Choice: Breast Milk and Formula  Interventions Interventions: Education;LC Services brochure  Consult Status Consult Status: Follow-up Date: 07/27/22 Follow-up type: In-patient    Vivianne Master [facility] 07/26/2022, 12:42 PM

## 2022-07-27 MED ORDER — IBUPROFEN 600 MG PO TABS: 600.0000 mg | ORAL_TABLET | Freq: Four times a day (QID) | ORAL | 1 refills | Status: DC | PRN

## 2022-07-27 MED ORDER — OXYCODONE HCL 5 MG PO TABS: 5.0000 mg | ORAL_TABLET | ORAL | 0 refills | Status: DC | PRN

## 2022-07-27 MED ORDER — FERROUS SULFATE 324 MG PO TBEC: 324.0000 mg | DELAYED_RELEASE_TABLET | ORAL | 0 refills | Status: DC

## 2022-07-27 MED ORDER — SENNOSIDES-DOCUSATE SODIUM 8.6-50 MG PO TABS: 2.0000 | ORAL_TABLET | Freq: Every day | ORAL | 2 refills | Status: DC | PRN

## 2022-07-27 MED ORDER — ACETAMINOPHEN 500 MG PO TABS: 1000.0000 mg | ORAL_TABLET | Freq: Four times a day (QID) | ORAL | 0 refills | Status: AC | PRN

## 2022-07-27 NOTE — Discharge Summary (Signed)
Physician Discharge Summary  Patient ID: Annette Archer MRN: [id number] DOB/AGE: 28-Jan-2001 21 y.o.  Admit date: 07/25/2022 Discharge date: 07/27/2022  Admission Diagnoses: H/o c-section  Discharge Diagnoses:  Principal Problem:   Status post repeat low transverse cesarean section Active Problems:   S/P cesarean section   Discharged Condition: stable  Hospital Course: Uncomplicated post op course.  Pt with asymptomatic anemia.  Starting hgb 7.1 and on POD 1 hgb 6.1.  Pt received IV venofer.  She is doing well on POD 2 and asking to be discharged.  She denies any sxs of anemia and bleeding is minimal.  Discharge instructions have been reviewed and pt was instructed to schedule a post op f/u d/t anemia and to eval for PP depression in 1wk in the office.  She is breast feeding and undecided about birth control.  Order for dressing change prior to discharge.  Consults: None  Significant Diagnostic Studies: labs: Hgb 6.1  Treatments: IV venofer  Discharge Exam: Blood pressure 126/63, pulse 60, temperature 98.3 F (36.8 C), temperature source Oral, resp. rate 18, height 5\' 4"  (1.626 m), weight 95.3 kg, last menstrual period 08/04/2021, SpO2 100 %, currently breastfeeding. General appearance: alert and no distress Resp: clear to auscultation bilaterally Cardio: regular rate and rhythm Extremities: no calf tenderness Incision/Wound: dressing intact with staining but dry otherwise FF, minimal lochia  Disposition: Discharge disposition: 01-Home or Self Care        Allergies as of 07/27/2022   No Known Allergies      Medication List     STOP taking these medications    aspirin 81 MG chewable tablet   iron polysaccharides 150 MG capsule Commonly known as: NIFEREX   terconazole 0.4 % vaginal cream Commonly known as: TERAZOL 7       TAKE these medications    acetaminophen 500 MG tablet Commonly known as: TYLENOL Take 2 tablets (1,000 mg total) by mouth every 6  (six) hours as needed. What changed: reasons to take this   ferrous sulfate 324 MG Tbec Take 1 tablet (324 mg total) by mouth every other day.   ibuprofen 600 MG tablet Commonly known as: ADVIL Take 1 tablet (600 mg total) by mouth every 6 (six) hours as needed.   oxyCODONE 5 MG immediate release tablet Commonly known as: Oxy IR/ROXICODONE Take 1 tablet (5 mg total) by mouth every 4 (four) hours as needed for moderate pain.   Prenatal 19 tablet Take 2 tablets by mouth daily. Gummy   senna-docusate 8.6-50 MG tablet Commonly known as: Senokot-S Take 2 tablets by mouth daily as needed for mild constipation.   valACYclovir 500 MG tablet Commonly known as: VALTREX Take 500 mg by mouth daily as needed (Blister).        Follow-up [facility]. Schedule an appointment as soon as possible for a visit in 1 week(s).   Specialty: Obstetrics and Gynecology Why: for follow up d/t low hemoglobin and eval for PP depression Contact information: [address] [place] [address] [facility]. Schedule an appointment as soon as possible for a visit in 6 week(s).   Specialty: Obstetrics and Gynecology Why: For Postpartum follow-up Contact information: [address] [place]  [address] [phone]                Signed: [name] 07/27/2022, 3:33 PM

## 2022-07-27 NOTE — Lactation Note (Signed)
This note was copied from a baby's chart. Lactation Consultation Note  Patient Name: Girl Rateel Beldin [id number] Date: 07/27/2022 Reason for consult: Follow-up assessment;Term;Infant weight loss (8 % weight loss, Per Birth parent, plans to Br and formula. LC reviewed supply and demand/ importance of latching the baby every feeding and then supplement due  to the wt loss. LC  provided a hand pump. LC reviewed BF D/C teaching and [place] resources.) Age:23 hours  Maternal Data Has patient been taught Hand Expression?: Yes  Feeding Mother's Current Feeding Choice: Breast Milk and Formula Nipple Type: Slow - flow  LATCH Score  Lactation Tools Discussed/Used  Hand pump - #24 F comfortable and #27 F given for when milk comes in   Interventions Interventions: Education;Hand pump;Breast feeding basics reviewed;LC Services brochure  Discharge Discharge Education: Engorgement and breast care;Warning signs for feeding baby Pump: Manual;Personal (per mom has a DEBP at home - and need to order parts)  Consult Status Consult Status: Complete Date: 07/27/22    Myer Haff 07/27/2022, 8:42 AM

## 2022-07-28 LAB — TYPE AND SCREEN
ABO/RH(D): A POS
Antibody Screen: NEGATIVE
Unit division: 0
Unit division: 0
Unit division: 0
Unit division: 0

## 2022-07-28 LAB — BPAM RBC
Blood Product Expiration Date: 202309232359
Blood Product Expiration Date: 202309232359
Blood Product Expiration Date: 202309242359
Blood Product Expiration Date: 202309252359
ISSUE DATE / TIME: 202309190529
ISSUE DATE / TIME: 202309190905
ISSUE DATE / TIME: 202309191356
ISSUE DATE / TIME: 202309200602
Unit Type and Rh: 6200
Unit Type and Rh: 6200
Unit Type and Rh: 6200
Unit Type and Rh: 6200

## 2022-07-28 MED FILL — Heparin Sodium (Porcine) Inj 1000 Unit/ML: INTRAMUSCULAR | Qty: 30 | Status: AC

## 2022-07-28 MED FILL — Sodium Chloride IV Soln 0.9%: INTRAVENOUS | Qty: 1000 | Status: AC

## 2022-08-02 ENCOUNTER — Telehealth (HOSPITAL_COMMUNITY): Payer: Self-pay | Admitting: *Deleted

## 2022-08-02 NOTE — Telephone Encounter (Signed)
Mom reports feeling good. Incision healing well per mom. No concerns regarding herself at this time. EPDS=4 (hospital score=4) Mom reports baby is well. Feeding, peeing, and pooping without difficulty. Reviewed safe sleep. Mom has no concerns about baby at present.  Odis Hollingshead, RN 08-02-2022 at 2:07pm

## 2022-08-08 ENCOUNTER — Other Ambulatory Visit: Payer: Self-pay

## 2022-08-08 ENCOUNTER — Encounter (HOSPITAL_COMMUNITY): Payer: Self-pay | Admitting: Emergency Medicine

## 2022-08-08 ENCOUNTER — Emergency Department (HOSPITAL_COMMUNITY)
Admission: EM | Admit: 2022-08-08 | Discharge: 2022-08-09 | Discharge status: Against Medical Advice | Payer: Medicaid Other | Attending: Emergency Medicine | Admitting: Emergency Medicine

## 2022-08-08 DIAGNOSIS — Y838 Other surgical procedures as the cause of abnormal reaction of the patient, or of later complication, without mention of misadventure at the time of the procedure: Secondary | ICD-10-CM | POA: Insufficient documentation

## 2022-08-08 DIAGNOSIS — Z5321 Procedure and treatment not carried out due to patient leaving prior to being seen by health care provider: Secondary | ICD-10-CM | POA: Insufficient documentation

## 2022-08-08 NOTE — ED Provider Triage Note (Signed)
Emergency Medicine Provider Triage Evaluation Note  Annette Archer , a 21 y.o. female  was evaluated in triage.  Pt complains of foul-smelling drainage from C-section site.  Patient states that she had a C-section on the 18th of last month.  She notes serous drainage from site of which she has been seen at [facility]/symptoms.  Denies fever, purulent drainage, extremity redness/tenderness.  Review of Systems  Positive: See above Negative:   Physical Exam  BP 133/70 (BP Location: Right Arm)   Pulse 61   Temp 98.9 F (37.2 C) (Oral)   Resp 15   Ht 5\' 4"  (1.626 m)   Wt 88.5 kg   LMP 08/04/2021   SpO2 100%   BMI 33.47 kg/m  Gen:   Awake, no distress   Resp:  Normal effort  MSK:   Moves extremities without difficulty  Other:  Serous drainage from left lateral aspect of C-section site.  Medical Decision Making  Medically screening exam initiated at 10:02 PM.  Appropriate orders placed.  Annette Archer was informed that the remainder of the evaluation will be completed by another provider, this initial triage assessment does not replace that evaluation, and the importance of remaining in the ED until their evaluation is complete.     Annette Archer, [place] 08/08/22 2203

## 2022-08-08 NOTE — ED Triage Notes (Signed)
Pt reports having a C-section 07/25/22, reports one of her stitches came loose from her incision on 07/29/22 and was seen at [facility]; reports they did not re-stitch due to fluid accumulation; pt reports she now foul smell and pain to incision site

## 2022-08-09 NOTE — ED Notes (Signed)
x2 no response

## 2023-05-19 IMAGING — US US OB < 14 WEEKS - US OB TV
1 series · 15 of 22 positions shown · non-contrast
Comparison: None.

CLINICAL DATA: Initial evaluation for early pregnancy.

EXAM:
OBSTETRIC <14 WK US AND TRANSVAGINAL OB US
TECHNIQUE: Both transabdominal and transvaginal ultrasound examinations were
performed for complete evaluation of the gestation as well as the
maternal uterus, adnexal regions, and pelvic cul-de-sac.
Transvaginal technique was performed to assess early pregnancy.

[Series 1: us ob < 14 weeks - us ob tv · 22 acquisitions, 15 frames shown]
[im 1/22]
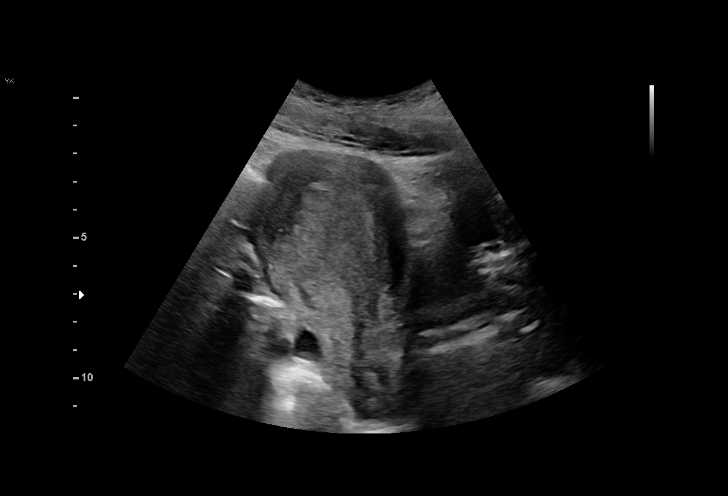
[im 3/22]
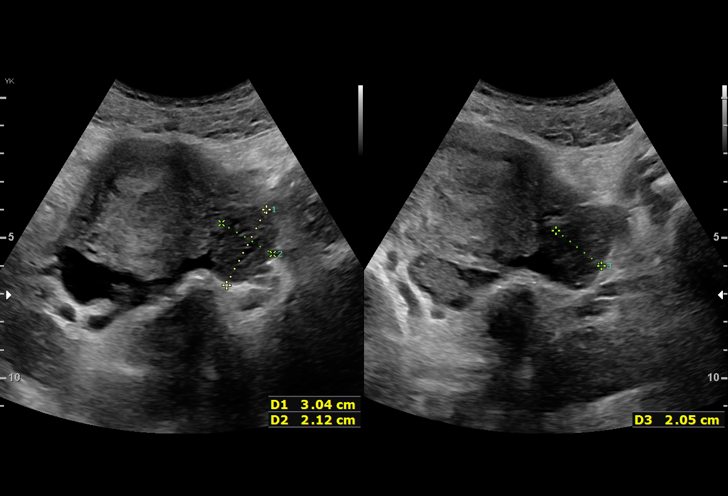
[im 4/22]
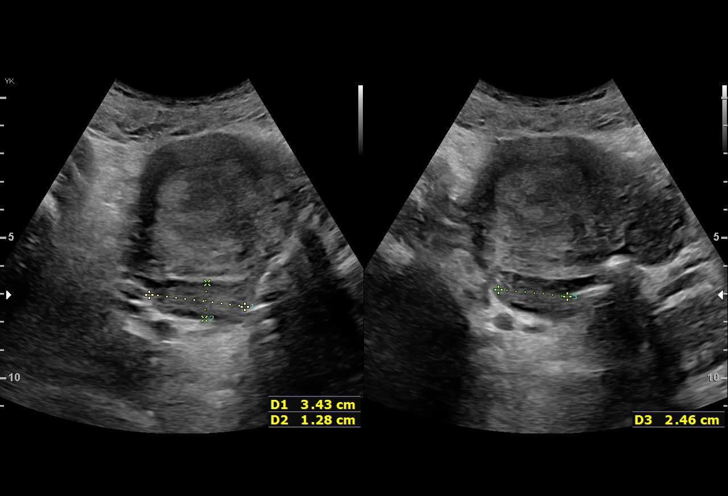
[im 6/22]
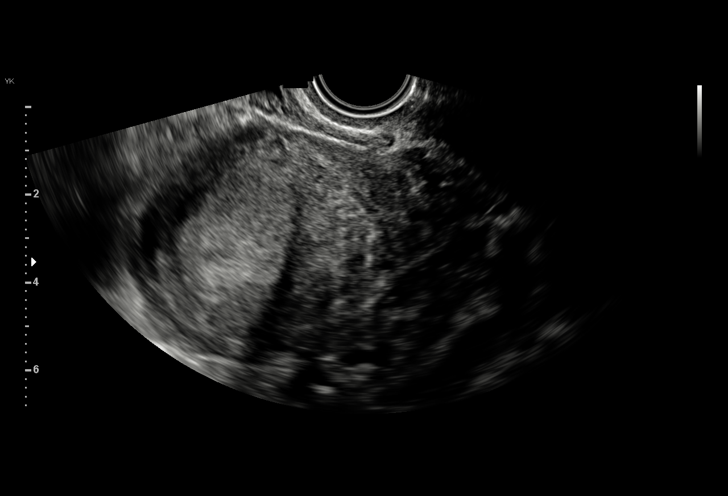
[im 7/22]
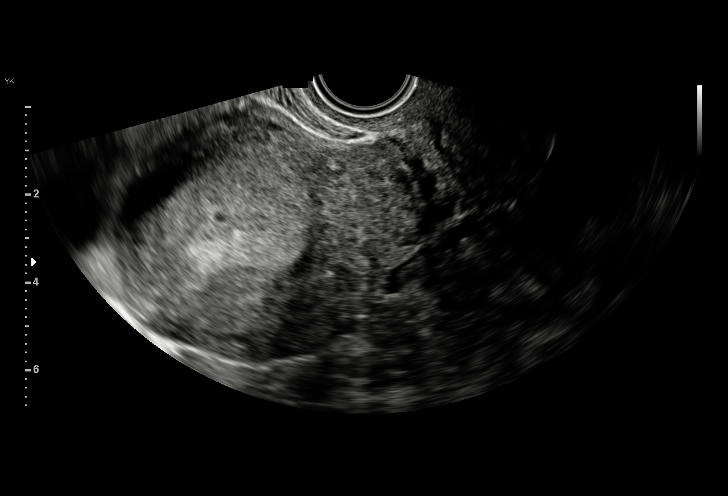
[im 9/22]
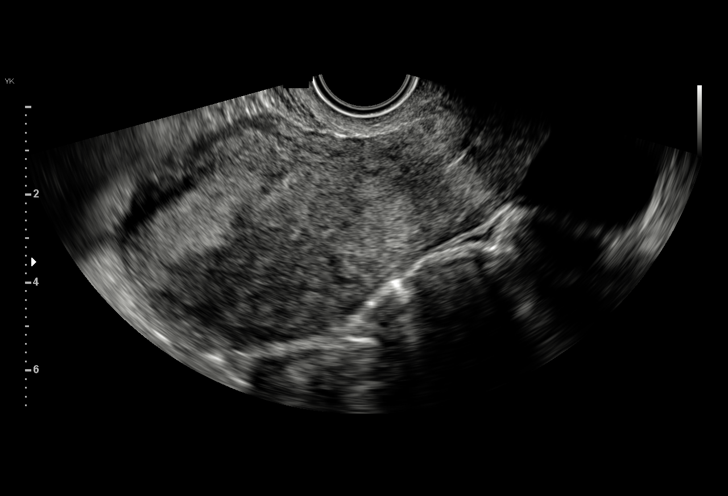
[im 10/22]
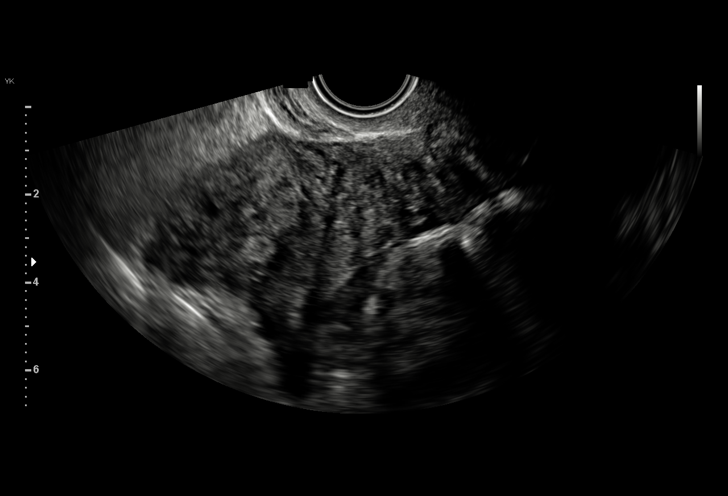
[im 12/22]
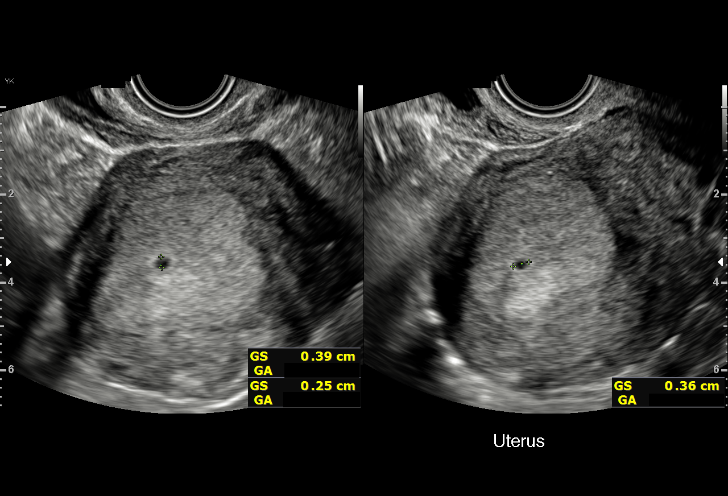
[im 13/22]
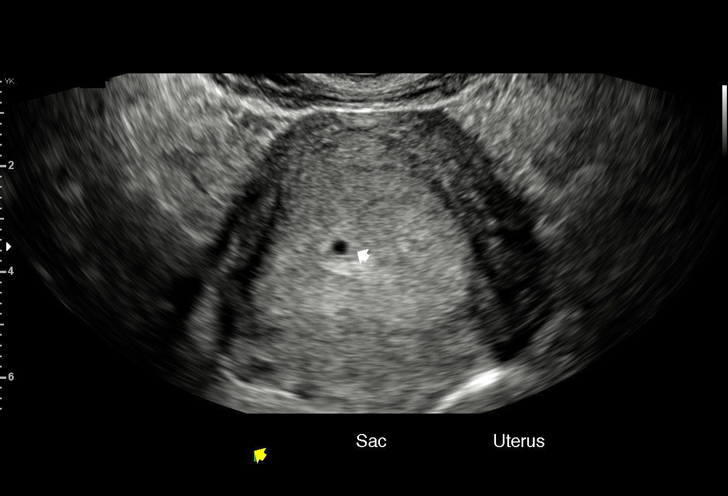
[im 14/22]
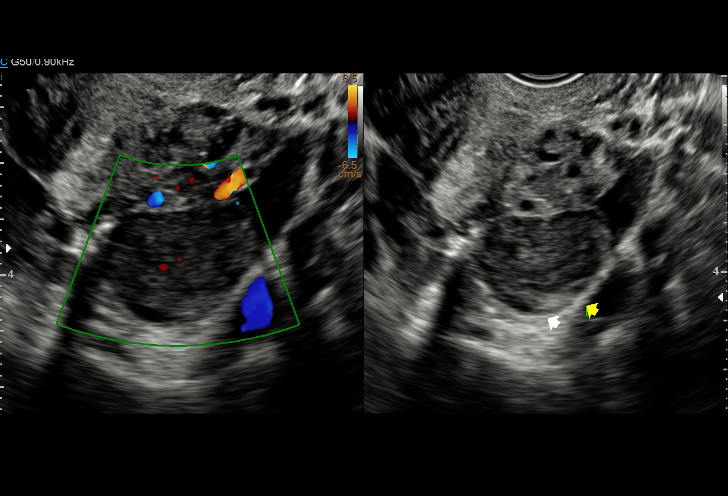
[im 16/22]
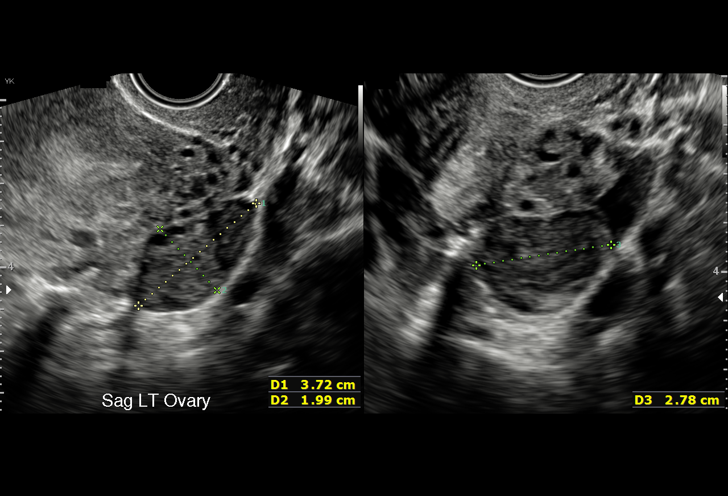
[im 17/22]
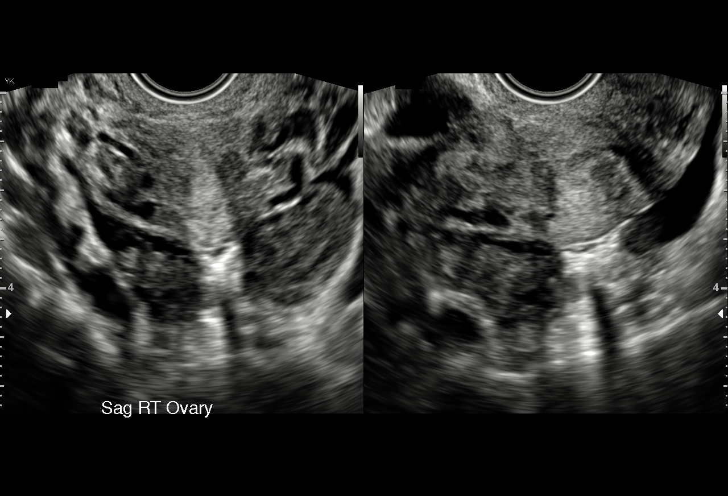
[im 19/22]
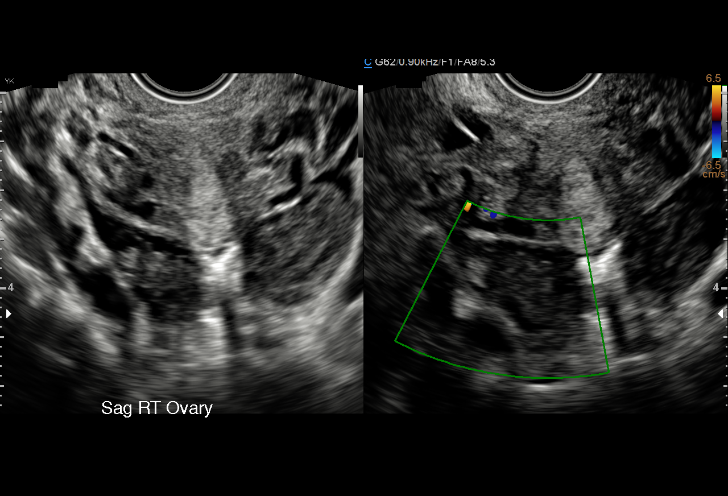
[im 20/22]
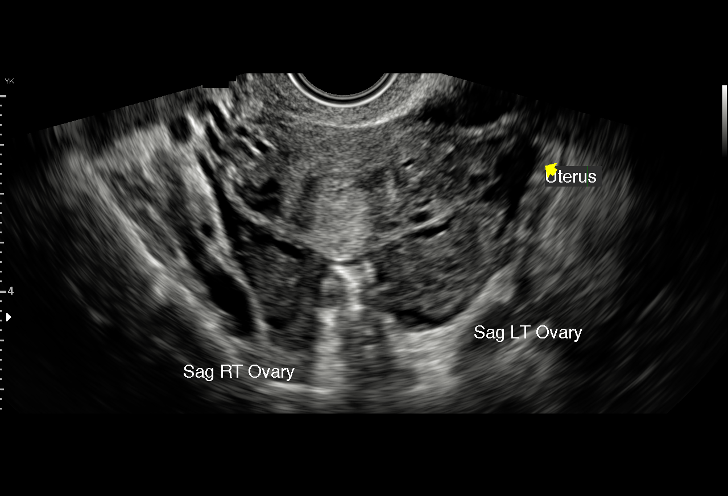
[im 22/22]
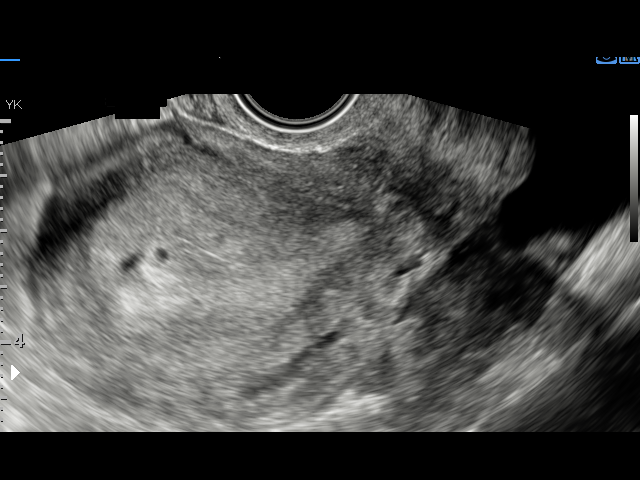

[15 of 22 positions shown; findings below may reference images not displayed]

FINDINGS: Intrauterine gestational sac: Single

Yolk sac:  Negative.

Embryo:  Negative.

Cardiac Activity: Negative.

Heart Rate: N/A

MSD: 3.3 mm   5 w   0 d

Subchorionic hemorrhage:  None visualized.

Maternal uterus/adnexae: Ovaries within normal limits. No adnexal
mass. Small volume free fluid seen within the pelvis.
IMPRESSION: 1. Probable early intrauterine gestational sac, but no yolk sac,
fetal pole, or cardiac activity yet visualized. Recommend follow-up
quantitative B-HCG levels and follow-up US in 14 days to confirm and
assess viability. This recommendation follows SRU consensus
guidelines: Diagnostic Criteria for Nonviable Pregnancy Early in the
First Trimester. N Engl J Med 2568; [DATE].
2. Small volume free fluid within the pelvis.
3. No other acute maternal uterine or adnexal abnormality.

## 2023-06-14 LAB — HEPATITIS C ANTIBODY: HCV Ab: NEGATIVE

## 2023-06-14 LAB — OB RESULTS CONSOLE ANTIBODY SCREEN: Antibody Screen: NEGATIVE

## 2023-06-14 LAB — OB RESULTS CONSOLE GC/CHLAMYDIA
Chlamydia: NEGATIVE
Neisseria Gonorrhea: NEGATIVE

## 2023-06-14 LAB — OB RESULTS CONSOLE RPR: RPR: NONREACTIVE

## 2023-06-14 LAB — OB RESULTS CONSOLE HIV ANTIBODY (ROUTINE TESTING): HIV: NONREACTIVE

## 2023-06-14 LAB — OB RESULTS CONSOLE HEPATITIS B SURFACE ANTIGEN: Hepatitis B Surface Ag: NEGATIVE

## 2023-06-14 LAB — OB RESULTS CONSOLE RUBELLA ANTIBODY, IGM: Rubella: IMMUNE

## 2023-07-31 ENCOUNTER — Encounter (HOSPITAL_COMMUNITY): Payer: Self-pay

## 2023-07-31 ENCOUNTER — Inpatient Hospital Stay (HOSPITAL_COMMUNITY): Payer: 59

## 2023-07-31 ENCOUNTER — Inpatient Hospital Stay (HOSPITAL_COMMUNITY)
Admission: AD | Admit: 2023-07-31 | Discharge: 2023-07-31 | Disposition: A | Discharge status: Home / Self Care | Payer: 59 | Attending: Obstetrics and Gynecology | Admitting: Obstetrics and Gynecology

## 2023-07-31 DIAGNOSIS — Y92003 Bedroom of unspecified non-institutional (private) residence as the place of occurrence of the external cause: Secondary | ICD-10-CM | POA: Diagnosis not present

## 2023-07-31 DIAGNOSIS — Z3A23 23 weeks gestation of pregnancy: Secondary | ICD-10-CM | POA: Diagnosis not present

## 2023-07-31 DIAGNOSIS — W19XXXA Unspecified fall, initial encounter: Secondary | ICD-10-CM

## 2023-07-31 DIAGNOSIS — M25662 Stiffness of left knee, not elsewhere classified: Secondary | ICD-10-CM | POA: Diagnosis not present

## 2023-07-31 DIAGNOSIS — W01190A Fall on same level from slipping, tripping and stumbling with subsequent striking against furniture, initial encounter: Secondary | ICD-10-CM | POA: Insufficient documentation

## 2023-07-31 DIAGNOSIS — O9A212 Injury, poisoning and certain other consequences of external causes complicating pregnancy, second trimester: Secondary | ICD-10-CM | POA: Insufficient documentation

## 2023-07-31 DIAGNOSIS — M25562 Pain in left knee: Secondary | ICD-10-CM | POA: Insufficient documentation

## 2023-07-31 DIAGNOSIS — M25462 Effusion, left knee: Secondary | ICD-10-CM | POA: Diagnosis not present

## 2023-07-31 MED ORDER — IBUPROFEN 600 MG PO TABS
600.0000 mg | ORAL_TABLET | Freq: Once | ORAL | Status: AC
  Administered 2023-07-31: 600 mg via ORAL
  Filled 2023-07-31: qty 1

## 2023-07-31 MED ORDER — CYCLOBENZAPRINE HCL 5 MG PO TABS
10.0000 mg | ORAL_TABLET | Freq: Once | ORAL | Status: AC
  Administered 2023-07-31: 10 mg via ORAL
  Filled 2023-07-31: qty 2

## 2023-07-31 NOTE — MAU Note (Addendum)
.  Annette Archer is a 22 y.o. at [redacted]w[redacted]d here in MAU reporting: Larey Seat two days ago onto her right side onto a children's cot in her child's room. She reports she did not hit her abdomen or her head. She reports her left knee popped out and popped back into place and she is experiencing discomfort. She reports her left knee is swollen and has not gone down. Able to ambulate and move her knee but has limited mobility. Reports it feels as if something is pulling on the back of her knee and also reports a cracking/popping sensation when she walks. If her knee is at rest she reports the pain is still there and she experiences "tingling" sensations. Denies VB or LOF. Reports vigorous movement.  Onset of complaint: Two days ago Pain score: 7/10 left knee   FHT: 152 initial external Lab orders placed from triage:  none

## 2023-07-31 NOTE — MAU Provider Note (Signed)
History     CSN: [id number]  Arrival date and time: 07/31/23 1356   Event Date/Time   First Provider Initiated Contact with Patient 07/31/23 1503      Chief Complaint  Patient presents with   Fall    Annette Archer is a 22 y.o. G3P2002 at [redacted]w[redacted]d who receives care at [facility].  She presents today for knee pain and swelling. She states she fell two days ago and felt her left knee pop out and then back into place. She reports she is having discomfort despite tylenol and icing area.  She states she can put weight on the leg, but feels it is cracking/popping sensation with ambulation.  She also states it feels like something is "pulling" in the back of her leg behind the knee as well as a "tingling sensation."  No vaginal bleeding or discharge of concern.  She endorses fetal movement.   OB History     Gravida  3   Para  2   Term  2   Preterm  0   AB  0   Living  2      SAB  0   IAB  0   Ectopic  0   Multiple  0   Live Births  2           Past Medical History:  Diagnosis Date   ADHD (attention deficit hyperactivity disorder)    Anemia    Blood transfusion without reported diagnosis    Depression    ODD (oppositional defiant disorder)     Past Surgical History:  Procedure Laterality Date   CESAREAN SECTION  02/07/2021   Procedure: CESAREAN SECTION;  Surgeon: Steva Ready, DO;  Location: [facility] LD ORS;  Service: Obstetrics;;   CESAREAN SECTION N/A 07/25/2022   Procedure: CESAREAN SECTION;  Surgeon: Osborn Coho, MD;  Location: [facility] LD ORS;  Service: Obstetrics;  Laterality: N/A;   NO PAST SURGERIES      Family History  Problem Relation Age of Onset   Healthy Mother    Healthy Father     Social History   Tobacco Use   Smoking status: Some Days    Types: Cigarettes   Smokeless tobacco: Never   Tobacco comments:    cigars  Vaping Use   Vaping status: Never Used  Substance Use Topics   Alcohol use: Not Currently   Drug use: Not  Currently    Types: Marijuana, Other-see comments, MDMA (Ecstacy)    Comment: 11/24/21 took ecstacy; last used marijuana 11/25/21    Allergies: No Known Allergies  Medications Prior to Admission  Medication Sig Dispense Refill Last Dose   acetaminophen (TYLENOL) 500 MG tablet Take 2 tablets (1,000 mg total) by mouth every 6 (six) hours as needed. 40 tablet 0    ferrous sulfate 324 MG TBEC Take 1 tablet (324 mg total) by mouth every other day. 60 tablet 0    ibuprofen (ADVIL) 600 MG tablet Take 1 tablet (600 mg total) by mouth every 6 (six) hours as needed. 30 tablet 1    oxyCODONE (OXY IR/ROXICODONE) 5 MG immediate release tablet Take 1 tablet (5 mg total) by mouth every 4 (four) hours as needed for moderate pain. 30 tablet 0    Prenatal Vit-DSS-Fe Fum-FA (PRENATAL 19) tablet Take 2 tablets by mouth daily. Gummy      senna-docusate (SENOKOT-S) 8.6-50 MG tablet Take 2 tablets by mouth daily as needed for mild constipation. 30 tablet 2  valACYclovir (VALTREX) 500 MG tablet Take 500 mg by mouth daily as needed (Blister).       Review of Systems  Constitutional:  Negative for chills and fever.  Gastrointestinal:  Negative for abdominal pain and nausea.  Genitourinary:  Negative for difficulty urinating, dysuria, vaginal bleeding and vaginal discharge.  Musculoskeletal:        Left knee swelling Limited ROM   Physical Exam   Blood pressure (!) 106/54, pulse 91, resp. rate 16, height 5\' 5"  (1.651 m), weight 83.2 kg, SpO2 99%, currently breastfeeding.  Physical Exam Vitals reviewed. Chaperone present: [name], Charity fundraiser.  Constitutional:      Appearance: Normal appearance.  HENT:     Head: Normocephalic and atraumatic.  Eyes:     Conjunctiva/sclera: Conjunctivae normal.  Cardiovascular:     Rate and Rhythm: Normal rate.  Pulmonary:     Effort: Pulmonary effort is normal. No respiratory distress.  Musculoskeletal:     Cervical back: Normal range of motion.     Left knee: Swelling  present. No lacerations or crepitus. Decreased range of motion. Tenderness present. Normal alignment.       Legs:     Comments: Swelling above quad. Mild tenderness in area.  Able to apply pressure to leg.  Skin:    General: Skin is warm and dry.  Neurological:     Mental Status: She is alert and oriented to person, place, and time.  Psychiatric:        Mood and Affect: Mood normal.        Behavior: Behavior normal.     MAU Course  Procedures DG Knee 2 Views Left  Result Date: 07/31/2023 CLINICAL DATA:  Knee swelling, limited range of motion EXAM: LEFT KNEE - 1-2 VIEW COMPARISON:  09/22/2020 FINDINGS: No evidence of fracture, dislocation, or joint effusion. No evidence of arthropathy or other focal bone abnormality. Soft tissues are unremarkable. IMPRESSION: Negative. Electronically Signed   By: Judie Petit.  Shick M.D.   On: 07/31/2023 15:47    MDM Pain Medication XRay Assessment and Plan  22 year old, G3P2002  SIUP at 23.2 weeks S/P Fall  -Reviewed POC with patient. -Exam performed and findings discussed.  -Informed that can send for XRay of area, but anything abnormal would require consult with orthopedics.  -Patient agreeable. -Discussed usage of ice, ibuprofen, and flexeril for pain. Patient agreeable. -Orders placed. -Await results and reassess.   Cherre Robins 07/31/2023, 3:04 PM   Reassessment (4:13 PM) -Xray results as above. -Provider to bedside to discuss findings. -Encouraged to continue to elevate, ice, and use tylenol for pain. -Patient without questions. -Precautions reviewed. -Encouraged to call primary office or return to MAU if symptoms worsen or with the onset of new symptoms. -Discharged to home in stable condition.  Cherre Robins MSN, CNM Advanced Practice Provider, [facility] [employer] [facility]

## 2023-10-20 ENCOUNTER — Other Ambulatory Visit: Payer: Self-pay | Admitting: Obstetrics & Gynecology

## 2023-11-06 NOTE — Patient Instructions (Addendum)
 [name]  11/06/2023   Your procedure is scheduled on:  11/16/2022  Arrive at 1230 at Entrance C on [address] at [facility]  and [employer]. You are invited to use the FREE valet parking or use the Visitor's parking deck.  Pick up the phone at the desk and dial [phone].  Call this number if you have problems the morning of surgery: [phone]  Remember:   Do not eat food:(After Midnight) Desps de medianoche.  You may drink clear liquids until arrival at ___1230__.  Clear liquids means a liquid you can see thru.  It can have color such as Cola or Kool aid.  Tea is OK and coffee as long as no milk or creamer of any kind..  Take these medicines the morning of surgery with A SIP OF WATER :  none   Do not wear jewelry, make-up or nail polish.  Do not wear lotions, powders, or perfumes. Do not wear deodorant.  Do not shave 48 hours prior to surgery.  Do not bring valuables to the hospital.  [facility] is not   responsible for any belongings or valuables brought to the hospital.  Contacts, dentures or bridgework may not be worn into surgery.  Leave suitcase in the car. After surgery it may be brought to your room.  For patients admitted to the hospital, checkout time is 11:00 AM the day of              discharge.      Please read over the following fact sheets that you were given:     Preparing for Surgery

## 2023-11-07 ENCOUNTER — Encounter (HOSPITAL_COMMUNITY): Payer: Self-pay

## 2023-11-07 ENCOUNTER — Telehealth (HOSPITAL_COMMUNITY): Payer: Self-pay | Admitting: *Deleted

## 2023-11-07 NOTE — Telephone Encounter (Signed)
 Preadmission screen

## 2023-11-15 ENCOUNTER — Encounter (HOSPITAL_COMMUNITY)
Admission: RE | Admit: 2023-11-15 | Discharge: 2023-11-15 | Disposition: A | Discharge status: Home / Self Care | Payer: 59 | Source: Ambulatory Visit | Attending: Obstetrics & Gynecology | Admitting: Obstetrics & Gynecology

## 2023-11-15 VITALS — Ht 65.0 in | Wt 213.0 lb

## 2023-11-15 DIAGNOSIS — Z01812 Encounter for preprocedural laboratory examination: Secondary | ICD-10-CM | POA: Insufficient documentation

## 2023-11-15 DIAGNOSIS — Z98891 History of uterine scar from previous surgery: Secondary | ICD-10-CM | POA: Insufficient documentation

## 2023-11-15 LAB — CBC
HCT: 30.4 % — ABNORMAL LOW (ref 36.0–46.0)
Hemoglobin: 8.8 g/dL — ABNORMAL LOW (ref 12.0–15.0)
MCH: 20.6 pg — ABNORMAL LOW (ref 26.0–34.0)
MCHC: 28.9 g/dL — ABNORMAL LOW (ref 30.0–36.0)
MCV: 71 fL — ABNORMAL LOW (ref 80.0–100.0)
Platelets: 255 10*3/uL (ref 150–400)
RBC: 4.28 MIL/uL (ref 3.87–5.11)
RDW: 19 % — ABNORMAL HIGH (ref 11.5–15.5)
WBC: 8.6 10*3/uL (ref 4.0–10.5)
nRBC: 0 % (ref 0.0–0.2)

## 2023-11-15 LAB — RPR: RPR Ser Ql: NONREACTIVE

## 2023-11-16 NOTE — H&P (Addendum)
 Annette Archer is a 23 y.o. female presenting for a repeat cesarean section (history of 2 prior cesarean sections) and permanent sterilization via removal of both fallopian tubes.  EDC 11/24/2023 by second trimester U/S, patient is a [id number].   Prenatal care provided at [facility] and has been significant for iron  deficiency anemia.   OB History     Gravida  3   Para  2   Term  2   Preterm  0   AB  0   Living  2      SAB  0   IAB  0   Ectopic  0   Multiple  0   Live Births  2          Past Medical History:  Diagnosis Date   ADHD (attention deficit hyperactivity disorder)    Anemia    Blood transfusion without reported diagnosis    Depression    ODD (oppositional defiant disorder)    Past Surgical History:  Procedure Laterality Date   CESAREAN SECTION  02/07/2021   Procedure: CESAREAN SECTION;  Surgeon: Storm Setter, DO;  Location: [facility] LD [facility];  Service: Obstetrics;;   CESAREAN SECTION N/A 07/25/2022   Procedure: CESAREAN SECTION;  Surgeon: Henry Slough, MD;  Location: [facility] LD [facility]S;  Service: Obstetrics;  Laterality: N/A;   NO PAST SURGERIES     Family History: family history includes Healthy in her father and mother. Social History:  reports that she has been smoking cigarettes. She has never used smokeless tobacco. She reports that she does not currently use alcohol. She reports that she does not currently use drugs after having used the following drugs: Marijuana, Other-see comments, and MDMA (Ecstacy).     Maternal Diabetes: No Genetic Screening: Normal Maternal Ultrasounds/Referrals: Normal Fetal Ultrasounds or other Referrals:  None Maternal Substance Abuse:  No Significant Maternal Medications:  None Significant Maternal Lab Results:  Group B Strep negative Number of Prenatal Visits:greater than 3 verified prenatal visits Maternal Vaccinations:TDap and Flu Other Comments:  None  Review of Systems Constitutional: Denies  fevers/chills Cardiovascular: Denies chest pain or palpitations Pulmonary: Denies coughing or wheezing Gastrointestinal: Denies nausea, vomiting or diarrhea Genitourinary: Denies pelvic pain, unusual vaginal bleeding, unusual vaginal discharge, dysuria, urgency or frequency.  Musculoskeletal: Denies muscle or joint aches and pain.  Neurology: Denies abnormal sensations such as tingling or numbness.     currently breastfeeding. Exam Physical Exam  Blood pressure 128/72, pulse 92, temperature 97.8 F (36.6 C), temperature source Oral, resp. rate 16, height 5' 5 (1.651 m), weight 96 kg, SpO2 98%, currently breastfeeding.  Constitutional: She is oriented to person, place, and time. She appears well-developed and well-nourished.  HENT:  Head: Normocephalic and atraumatic.  Neck: Normal range of motion.  Cardiovascular: Normal rate.    Respiratory: Effort normal.   GI: Soft.  Skin: Skin is warm and dry.  Psychiatric: She has a normal mood and affect. Her behavior is normal.   Genitourinary: Gravid uterus, appropriate for gestational age.    Prenatal labs: ABO, Rh: --/--/A POS (01/08 0950) Antibody: NEG (01/08 0950) Rubella: Immune (08/07 0000) RPR: NON REACTIVE (01/08 0946)  HBsAg: Negative (08/07 0000)  HIV: Non-reactive (08/07 0000)  GBS:   Negative.   Current Outpatient Medications  Medication Instructions   acetaminophen  (TYLENOL ) 1,000 mg, Oral, Every 6 hours PRN   Prenatal MV & Min w/FA-DHA (PRENATAL GUMMIES PO) 2 capsules, Oral, Daily    No Known drug allergies. With allergies to Caffeine, red  dye, yellow dye.    Recent Results (from the past 2160 hours)  CBC     Status: Abnormal   Collection Time: 11/15/23  9:46 AM  Result Value Ref Range   WBC 8.6 4.0 - 10.5 K/uL   RBC 4.28 3.87 - 5.11 MIL/uL   Hemoglobin 8.8 (L) 12.0 - 15.0 g/dL    Comment: Reticulocyte Hemoglobin testing may be clinically indicated, consider ordering this additional test [id number]    HCT 30.4  (L) 36.0 - 46.0 %   MCV 71.0 (L) 80.0 - 100.0 fL   MCH 20.6 (L) 26.0 - 34.0 pg   MCHC 28.9 (L) 30.0 - 36.0 g/dL   RDW 80.9 (H) 88.4 - 84.4 %   Platelets 255 150 - 400 K/uL    Comment: REPEATED TO VERIFY   nRBC 0.0 0.0 - 0.2 %    Comment: Performed at [facility], [address] [place], [place] [address]  RPR     Status: None   Collection Time: 11/15/23  9:46 AM  Result Value Ref Range   RPR Ser Ql NON REACTIVE NON REACTIVE    Comment: Performed at [facility], [address] [place], [place] [address]  Type and screen     Status: None   Collection Time: 11/15/23  9:50 AM  Result Value Ref Range   ABO/RH(D) A POS    Antibody Screen NEG    Sample Expiration      11/18/2023,2359 Performed at [facility], [address], [place], [place] [address]      Assessment/Plan: 23 y/o G3P2002 at [redacted] weeks EGA here for a repeat cesarean section and permanent sterilization via bilateral salpingectomy,  These procedures have been fully reviewed with the patient and written informed consent has been obtained.   Discussed with patient risks, benefits and alternatives of repeat cesarean section and bilateral salpingectomy including but not limited to risks of infection, damage to organs, heavy bleeding and possible placenta problems in future pregnancies.     Patient plans to use bilateral tubal ligation via complete bilateral salpingectomy for sterilization.  We discussed risks, benefits and alternatives of postpartum tubal ligation including but not limited to risks of bleeding, infection, damage to organs.  She also understood the risk of tubal regret but she states she is 100% sure she did not want any more children.  We discussed that complete removal of both tubes will ensure complete sterilization but as with every procedure it is not 100% guaranteed.  She understood there were other kinds of birth control such as pills, patches, IUDs, vaginal rings and depo provera which  were temporary but she did not desire them.  She understood there was also an option of female sterilization but she did not desire that option either.  She did not desire partial salpingectomy or tubal fulguration or placement of tubal clips.  All her questions were answered and she was consented for the procedure.    She was consented for cell saver use and possible blood transfusion.  I discussed with patient risks, benefits and alternatives of cell saver use and blood transfusion including but not limited to risks  of infection (through viruses, bacteria and parasites in donor blood), allergic, hemolytic and other immunologic transfusion reactions, volume overload, iron  overload.  She expressed understanding of all this.       Jerolyn LELON Foil. MD.  11/17/2023, 3:11 PM.

## 2023-11-17 ENCOUNTER — Encounter (HOSPITAL_COMMUNITY)
Admission: RE | Disposition: A | Discharge status: Home / Self Care | Payer: Self-pay | Source: Home / Self Care | Attending: Obstetrics & Gynecology

## 2023-11-17 ENCOUNTER — Inpatient Hospital Stay (HOSPITAL_COMMUNITY): Payer: 59 | Admitting: Certified Registered Nurse Anesthetist

## 2023-11-17 ENCOUNTER — Inpatient Hospital Stay (HOSPITAL_COMMUNITY)
Admission: RE | Admit: 2023-11-17 | Discharge: 2023-11-20 | DRG: 785 | Disposition: A | Discharge status: Home / Self Care | Payer: 59 | Attending: Obstetrics & Gynecology | Admitting: Obstetrics & Gynecology

## 2023-11-17 ENCOUNTER — Encounter (HOSPITAL_COMMUNITY): Payer: Self-pay | Admitting: Obstetrics & Gynecology

## 2023-11-17 ENCOUNTER — Other Ambulatory Visit: Payer: Self-pay

## 2023-11-17 DIAGNOSIS — Z98891 History of uterine scar from previous surgery: Principal | ICD-10-CM

## 2023-11-17 DIAGNOSIS — D509 Iron deficiency anemia, unspecified: Secondary | ICD-10-CM | POA: Diagnosis present

## 2023-11-17 DIAGNOSIS — Z302 Encounter for sterilization: Secondary | ICD-10-CM | POA: Diagnosis not present

## 2023-11-17 DIAGNOSIS — Z3A39 39 weeks gestation of pregnancy: Secondary | ICD-10-CM

## 2023-11-17 DIAGNOSIS — O34211 Maternal care for low transverse scar from previous cesarean delivery: Secondary | ICD-10-CM | POA: Diagnosis present

## 2023-11-17 DIAGNOSIS — O9902 Anemia complicating childbirth: Secondary | ICD-10-CM | POA: Diagnosis present

## 2023-11-17 DIAGNOSIS — Z9889 Other specified postprocedural states: Secondary | ICD-10-CM

## 2023-11-17 DIAGNOSIS — O99334 Smoking (tobacco) complicating childbirth: Secondary | ICD-10-CM | POA: Diagnosis present

## 2023-11-17 DIAGNOSIS — F17210 Nicotine dependence, cigarettes, uncomplicated: Secondary | ICD-10-CM | POA: Diagnosis present

## 2023-11-17 LAB — RESP PANEL BY RT-PCR (RSV, FLU A&B, COVID)  RVPGX2
Influenza A by PCR: NEGATIVE
Influenza B by PCR: NEGATIVE
Resp Syncytial Virus by PCR: NEGATIVE
SARS Coronavirus 2 by RT PCR: NEGATIVE

## 2023-11-17 LAB — PREPARE RBC (CROSSMATCH)

## 2023-11-17 SURGERY — Surgical Case
Anesthesia: Spinal

## 2023-11-17 MED ORDER — FENTANYL CITRATE (PF) 100 MCG/2ML IJ SOLN
INTRAMUSCULAR | Status: AC
  Filled 2023-11-17: qty 2

## 2023-11-17 MED ORDER — MORPHINE SULFATE (PF) 0.5 MG/ML IJ SOLN
INTRAMUSCULAR | Status: DC | PRN
  Administered 2023-11-17: 150 ug via INTRATHECAL

## 2023-11-17 MED ORDER — DIPHENHYDRAMINE HCL 50 MG/ML IJ SOLN
12.5000 mg | INTRAMUSCULAR | Status: DC | PRN
  Administered 2023-11-17: 12.5 mg via INTRAVENOUS

## 2023-11-17 MED ORDER — ONDANSETRON HCL 4 MG/2ML IJ SOLN
4.0000 mg | Freq: Three times a day (TID) | INTRAMUSCULAR | Status: DC | PRN
  Administered 2023-11-17: 4 mg via INTRAVENOUS
  Filled 2023-11-17: qty 2

## 2023-11-17 MED ORDER — TRANEXAMIC ACID-NACL 1000-0.7 MG/100ML-% IV SOLN
INTRAVENOUS | Status: AC
  Filled 2023-11-17: qty 100

## 2023-11-17 MED ORDER — SIMETHICONE 80 MG PO CHEW
80.0000 mg | CHEWABLE_TABLET | ORAL | Status: DC | PRN
Start: 2023-11-17 — End: 2023-11-20
  Administered 2023-11-18: 80 mg via ORAL

## 2023-11-17 MED ORDER — NALOXONE HCL 0.4 MG/ML IJ SOLN: 0.4000 mg | INTRAMUSCULAR | Status: DC | PRN

## 2023-11-17 MED ORDER — ZOLPIDEM TARTRATE 5 MG PO TABS: 5.0000 mg | ORAL_TABLET | Freq: Every evening | ORAL | Status: DC | PRN

## 2023-11-17 MED ORDER — SOD CITRATE-CITRIC ACID 500-334 MG/5ML PO SOLN
30.0000 mL | ORAL | Status: AC
  Administered 2023-11-17: 30 mL via ORAL

## 2023-11-17 MED ORDER — DIPHENHYDRAMINE HCL 25 MG PO CAPS: 25.0000 mg | ORAL_CAPSULE | Freq: Four times a day (QID) | ORAL | Status: DC | PRN

## 2023-11-17 MED ORDER — DEXAMETHASONE SODIUM PHOSPHATE 10 MG/ML IJ SOLN
INTRAMUSCULAR | Status: DC | PRN
  Administered 2023-11-17: 10 mg via INTRAVENOUS

## 2023-11-17 MED ORDER — OXYCODONE HCL 5 MG PO TABS
5.0000 mg | ORAL_TABLET | ORAL | Status: DC | PRN
Start: 2023-11-17 — End: 2023-11-20
  Administered 2023-11-18 – 2023-11-19 (×3): 10 mg via ORAL
  Administered 2023-11-19: 5 mg via ORAL
  Administered 2023-11-19 – 2023-11-20 (×3): 10 mg via ORAL
  Filled 2023-11-17 (×7): qty 2

## 2023-11-17 MED ORDER — KETOROLAC TROMETHAMINE 30 MG/ML IJ SOLN
INTRAMUSCULAR | Status: AC
  Filled 2023-11-17: qty 1

## 2023-11-17 MED ORDER — HYDROMORPHONE HCL 1 MG/ML IJ SOLN
0.5000 mg | INTRAMUSCULAR | Status: DC
  Administered 2023-11-17: 0.5 mg via INTRAVENOUS

## 2023-11-17 MED ORDER — ACETAMINOPHEN 500 MG PO TABS
1000.0000 mg | ORAL_TABLET | Freq: Four times a day (QID) | ORAL | Status: DC
  Administered 2023-11-17 – 2023-11-20 (×11): 1000 mg via ORAL
  Filled 2023-11-17 (×11): qty 2

## 2023-11-17 MED ORDER — SCOPOLAMINE 1 MG/3DAYS TD PT72: 1.0000 | MEDICATED_PATCH | Freq: Once | TRANSDERMAL | Status: DC

## 2023-11-17 MED ORDER — MORPHINE SULFATE (PF) 0.5 MG/ML IJ SOLN
INTRAMUSCULAR | Status: AC
  Filled 2023-11-17: qty 10

## 2023-11-17 MED ORDER — SIMETHICONE 80 MG PO CHEW
80.0000 mg | CHEWABLE_TABLET | Freq: Three times a day (TID) | ORAL | Status: DC
  Administered 2023-11-18 – 2023-11-20 (×7): 80 mg via ORAL
  Filled 2023-11-17 (×9): qty 1

## 2023-11-17 MED ORDER — SODIUM CHLORIDE 0.9% FLUSH: 3.0000 mL | INTRAVENOUS | Status: DC | PRN

## 2023-11-17 MED ORDER — NALOXONE HCL 4 MG/10ML IJ SOLN: 1.0000 ug/kg/h | INTRAVENOUS | Status: DC | PRN

## 2023-11-17 MED ORDER — HYDROMORPHONE HCL 1 MG/ML IJ SOLN
INTRAMUSCULAR | Status: AC
  Filled 2023-11-17: qty 0.5

## 2023-11-17 MED ORDER — SENNOSIDES-DOCUSATE SODIUM 8.6-50 MG PO TABS
2.0000 | ORAL_TABLET | Freq: Every day | ORAL | Status: DC
  Administered 2023-11-18 – 2023-11-20 (×3): 2 via ORAL
  Filled 2023-11-17 (×4): qty 2

## 2023-11-17 MED ORDER — HYDROMORPHONE HCL 1 MG/ML IJ SOLN
0.2000 mg | INTRAMUSCULAR | Status: DC | PRN
Start: 2023-11-17 — End: 2023-11-20

## 2023-11-17 MED ORDER — DIPHENHYDRAMINE HCL 50 MG/ML IJ SOLN
12.5000 mg | Freq: Once | INTRAMUSCULAR | Status: AC
Start: 2023-11-17 — End: 2023-11-17
  Administered 2023-11-17: 12.5 mg via INTRAVENOUS

## 2023-11-17 MED ORDER — PHENYLEPHRINE HCL-NACL 20-0.9 MG/250ML-% IV SOLN
INTRAVENOUS | Status: AC
  Filled 2023-11-17: qty 250

## 2023-11-17 MED ORDER — LACTATED RINGERS IV SOLN: INTRAVENOUS | Status: DC | PRN

## 2023-11-17 MED ORDER — ACETAMINOPHEN 10 MG/ML IV SOLN
INTRAVENOUS | Status: DC | PRN
  Administered 2023-11-17: 1000 mg via INTRAVENOUS

## 2023-11-17 MED ORDER — GUAIFENESIN 100 MG/5ML PO LIQD
10.0000 mL | ORAL | Status: DC | PRN
  Administered 2023-11-18 – 2023-11-20 (×7): 10 mL via ORAL
  Filled 2023-11-17 (×10): qty 10

## 2023-11-17 MED ORDER — OXYTOCIN-SODIUM CHLORIDE 30-0.9 UT/500ML-% IV SOLN
INTRAVENOUS | Status: DC | PRN
  Administered 2023-11-17: 300 mL via INTRAVENOUS

## 2023-11-17 MED ORDER — KETOROLAC TROMETHAMINE 30 MG/ML IJ SOLN
30.0000 mg | Freq: Once | INTRAMUSCULAR | Status: AC | PRN
  Administered 2023-11-17: 30 mg via INTRAVENOUS

## 2023-11-17 MED ORDER — ONDANSETRON HCL 4 MG/2ML IJ SOLN
INTRAMUSCULAR | Status: AC
  Filled 2023-11-17: qty 2

## 2023-11-17 MED ORDER — DIPHENHYDRAMINE HCL 25 MG PO CAPS
25.0000 mg | ORAL_CAPSULE | ORAL | Status: DC | PRN
Start: 2023-11-17 — End: 2023-11-20
  Administered 2023-11-17 – 2023-11-18 (×2): 25 mg via ORAL
  Filled 2023-11-17 (×3): qty 1

## 2023-11-17 MED ORDER — OXYTOCIN-SODIUM CHLORIDE 30-0.9 UT/500ML-% IV SOLN
2.5000 [IU]/h | INTRAVENOUS | Status: AC
  Administered 2023-11-17: 2.5 [IU]/h via INTRAVENOUS
  Filled 2023-11-17: qty 500

## 2023-11-17 MED ORDER — KETOROLAC TROMETHAMINE 30 MG/ML IJ SOLN
30.0000 mg | Freq: Four times a day (QID) | INTRAMUSCULAR | Status: AC
  Administered 2023-11-18 (×3): 30 mg via INTRAVENOUS
  Filled 2023-11-17 (×3): qty 1

## 2023-11-17 MED ORDER — DIPHENHYDRAMINE HCL 50 MG/ML IJ SOLN
INTRAMUSCULAR | Status: AC
  Filled 2023-11-17: qty 1

## 2023-11-17 MED ORDER — MEPERIDINE HCL 25 MG/ML IJ SOLN: 6.2500 mg | INTRAMUSCULAR | Status: DC | PRN

## 2023-11-17 MED ORDER — BUPIVACAINE IN DEXTROSE 0.75-8.25 % IT SOLN
INTRATHECAL | Status: DC | PRN
  Administered 2023-11-17: 1.6 mL via INTRATHECAL

## 2023-11-17 MED ORDER — IBUPROFEN 600 MG PO TABS
600.0000 mg | ORAL_TABLET | Freq: Four times a day (QID) | ORAL | Status: DC
  Administered 2023-11-18 – 2023-11-20 (×8): 600 mg via ORAL
  Filled 2023-11-17 (×8): qty 1

## 2023-11-17 MED ORDER — ONDANSETRON HCL 4 MG/2ML IJ SOLN
INTRAMUSCULAR | Status: DC | PRN
  Administered 2023-11-17: 4 mg via INTRAVENOUS

## 2023-11-17 MED ORDER — PHENYLEPHRINE HCL-NACL 20-0.9 MG/250ML-% IV SOLN
INTRAVENOUS | Status: DC | PRN
  Administered 2023-11-17: 60 ug/min via INTRAVENOUS

## 2023-11-17 MED ORDER — DEXMEDETOMIDINE HCL IN NACL 80 MCG/20ML IV SOLN
INTRAVENOUS | Status: DC | PRN
  Administered 2023-11-17 (×4): 8 ug via INTRAVENOUS

## 2023-11-17 MED ORDER — SOD CITRATE-CITRIC ACID 500-334 MG/5ML PO SOLN
ORAL | Status: AC
  Filled 2023-11-17: qty 30

## 2023-11-17 MED ORDER — HYDROMORPHONE HCL 1 MG/ML IJ SOLN
0.5000 mg | INTRAMUSCULAR | Status: DC | PRN
Start: 2023-11-17 — End: 2023-11-20

## 2023-11-17 MED ORDER — ACETAMINOPHEN 10 MG/ML IV SOLN
INTRAVENOUS | Status: AC
  Filled 2023-11-17: qty 100

## 2023-11-17 MED ORDER — FENTANYL CITRATE (PF) 100 MCG/2ML IJ SOLN
INTRAMUSCULAR | Status: DC | PRN
  Administered 2023-11-17: 15 ug via INTRATHECAL

## 2023-11-17 MED ORDER — TRANEXAMIC ACID-NACL 1000-0.7 MG/100ML-% IV SOLN
1000.0000 mg | INTRAVENOUS | Status: AC
Start: 2023-11-17 — End: 2023-11-17
  Administered 2023-11-17: 1000 mg via INTRAVENOUS

## 2023-11-17 MED ORDER — SODIUM CHLORIDE 0.9 % IR SOLN
Status: DC | PRN
  Administered 2023-11-17: 350 mL

## 2023-11-17 MED ORDER — STERILE WATER FOR IRRIGATION IR SOLN
Status: DC | PRN
  Administered 2023-11-17: 1

## 2023-11-17 MED ORDER — COCONUT OIL OIL
1.0000 | TOPICAL_OIL | Status: DC | PRN
Start: 2023-11-17 — End: 2023-11-20

## 2023-11-17 MED ORDER — CEFAZOLIN SODIUM-DEXTROSE 2-4 GM/100ML-% IV SOLN
INTRAVENOUS | Status: AC
  Filled 2023-11-17: qty 100

## 2023-11-17 MED ORDER — OXYTOCIN-SODIUM CHLORIDE 30-0.9 UT/500ML-% IV SOLN
INTRAVENOUS | Status: AC
Start: 2023-11-17 — End: ?
  Filled 2023-11-17: qty 500

## 2023-11-17 MED ORDER — CEFAZOLIN SODIUM-DEXTROSE 2-4 GM/100ML-% IV SOLN
2.0000 g | INTRAVENOUS | Status: AC
  Administered 2023-11-17: 2 g via INTRAVENOUS

## 2023-11-17 MED ORDER — MENTHOL 3 MG MT LOZG: 1.0000 | LOZENGE | OROMUCOSAL | Status: DC | PRN

## 2023-11-17 MED ORDER — PRENATAL MULTIVITAMIN CH
1.0000 | ORAL_TABLET | Freq: Every day | ORAL | Status: DC
  Administered 2023-11-18 – 2023-11-20 (×3): 1 via ORAL
  Filled 2023-11-17 (×3): qty 1

## 2023-11-17 MED ORDER — DEXAMETHASONE SODIUM PHOSPHATE 10 MG/ML IJ SOLN
INTRAMUSCULAR | Status: AC
  Filled 2023-11-17: qty 1

## 2023-11-17 SURGICAL SUPPLY — 35 items
BENZOIN TINCTURE PRP APPL 2/3 (GAUZE/BANDAGES/DRESSINGS) IMPLANT
CHLORAPREP W/TINT 26 (MISCELLANEOUS) ×2 IMPLANT
CLAMP UMBILICAL CORD (MISCELLANEOUS) ×1 IMPLANT
CLOTH BEACON ORANGE TIMEOUT ST (SAFETY) ×1 IMPLANT
DERMABOND ADVANCED .7 DNX12 (GAUZE/BANDAGES/DRESSINGS) ×1 IMPLANT
DISSECTOR SURG LIGASURE 21 (MISCELLANEOUS) IMPLANT
DRAPE C SECTION CLR SCREEN (DRAPES) ×1 IMPLANT
DRSG OPSITE POSTOP 4X10 (GAUZE/BANDAGES/DRESSINGS) ×1 IMPLANT
ELECT REM PT RETURN 9FT ADLT (ELECTROSURGICAL) ×1
ELECTRODE REM PT RTRN 9FT ADLT (ELECTROSURGICAL) ×1 IMPLANT
EXTRACTOR VACUUM KIWI (MISCELLANEOUS) ×1 IMPLANT
GAUZE SPONGE 4X4 12PLY STRL LF (GAUZE/BANDAGES/DRESSINGS) IMPLANT
GLOVE BIOGEL PI IND STRL 7.0 (GLOVE) ×2 IMPLANT
GLOVE SURG SS PI 6.5 STRL IVOR (GLOVE) ×1 IMPLANT
GOWN STRL REUS W/TWL LRG LVL3 (GOWN DISPOSABLE) ×2 IMPLANT
KIT ABG SYR 3ML LUER SLIP (SYRINGE) IMPLANT
MAT PREVALON FULL STRYKER (MISCELLANEOUS) IMPLANT
NDL HYPO 25X5/8 SAFETYGLIDE (NEEDLE) IMPLANT
NDL KEITH (NEEDLE) ×1 IMPLANT
NEEDLE HYPO 25X5/8 SAFETYGLIDE (NEEDLE)
NEEDLE KEITH (NEEDLE) ×1
NS IRRIG 1000ML POUR BTL (IV SOLUTION) ×1 IMPLANT
PACK C SECTION WH (CUSTOM PROCEDURE TRAY) ×1 IMPLANT
PAD OB MATERNITY 4.3X12.25 (PERSONAL CARE ITEMS) ×1 IMPLANT
RTRCTR C-SECT PINK 25CM LRG (MISCELLANEOUS) ×1 IMPLANT
SUT CHROMIC 1 CTX 36 (SUTURE) IMPLANT
SUT CHROMIC 2 0 CT 1 (SUTURE) ×1 IMPLANT
SUT PLAIN 1 NONE 54 (SUTURE) ×1 IMPLANT
SUT PLAIN 2 0 XLH (SUTURE) IMPLANT
SUT PLAIN ABS 2-0 CT1 27XMFL (SUTURE) IMPLANT
SUT VIC AB 0 CTX36XBRD ANBCTRL (SUTURE) ×1 IMPLANT
SUT VIC AB 1 CTX36XBRD ANBCTRL (SUTURE) ×2 IMPLANT
TOWEL OR 17X24 6PK STRL BLUE (TOWEL DISPOSABLE) ×1 IMPLANT
TRAY FOLEY W/BAG SLVR 14FR LF (SET/KITS/TRAYS/PACK) ×1 IMPLANT
WATER STERILE IRR 1000ML POUR (IV SOLUTION) ×1 IMPLANT

## 2023-11-17 NOTE — Transfer of Care (Signed)
 Immediate Anesthesia Transfer of Care Note  Patient: Annette Archer  Procedure(s) Performed: CESAREAN SECTION WITH BILATERAL TUBAL LIGATION APPLICATION OF CELL SAVER  Patient Location: PACU  Anesthesia Type:Spinal  Level of Consciousness: awake, alert , and oriented  Airway & Oxygen Therapy: Patient Spontanous Breathing  Post-op Assessment: Report given to RN and Post -op Vital signs reviewed and stable  Post vital signs: Reviewed and stable  Last Vitals:  Vitals Value Taken Time  BP 103/80 11/17/23 1745  Temp 36.5 C 11/17/23 1743  Pulse 94 11/17/23 1748  Resp 17 11/17/23 1748  SpO2 97 % 11/17/23 1748  Vitals shown include unfiled device data.  Last Pain:  Vitals:   11/17/23 1743  TempSrc: Oral  PainSc: 0-No pain         Complications: No notable events documented.

## 2023-11-17 NOTE — Op Note (Signed)
 Patient:  Annette Archer DOB: [birth year] MRN:  [id number]  DATE OF SURGERY:  11/17/2023.  PREOP DIAGNOSIS:  1. 39 week 0 day EGA IUP. 2.  History of 2 previous cesarean sections and desires a repeat cesarean delivery. 3. Multiparous patient desiring permanent sterilization.    POSTOP DIAGNOSIS: Same as above.    PROCEDURE:  1. Repeat low uterine segment transverse cesarean section via Transverse skin incision.    2. Postpartum bilateral tubal ligation via bilateral complete salpingectomy.   SURGEON: Dr.  Jerolyn Foil.  ASSISTANT: Dr. Mardy Shropshire, [facility]- fellow.   SURGEON ATTESTATION: I was present and scrubbed for the entire case.  Experienced assistants was required given the standard of surgical care and the complexity of the case.  The assistants were needed for exposure, dissection, suctioning, retraction, instrument exchange,  assisting with delivery with administration of fundal pressure, and for overall help during the surgery.    ANESTHESIA: Spinal, Dr. Cordella Fix.  COMPLICATIONS: None.  FINDINGS: Viable female infant in cephalic presentation, ROT position, weight 9 lbs 3.4 oz, Apgar scores of  8 and 9. Normal uterus.  Normal bilateral fallopian tubes and ovaries.  Moderate adhesions between rectus muscle and rectus fascia.  EBL:   178 cc.  IV FLUID:   1800 cc LR.   URINE OUTPUT:  100 cc clear urine.  INDICATIONS:  23 y/o [id number] who presented for a repeat cesarean and bilateral tubal ligation.    PROCEDURE:  She was taken to the operating room where her spinal anesthesia was found to be adequate. She was prepped abdominally with chloraprep and vaginally with betadine .  She was draped in the usual sterile fashion, foley catheter and SCDs was placed. She received IV ancef   and IV tranexamic acid  preoperatively. A cells saver was available to be used for the procedure.  A transverse incision was made above the previous pfannenstiel incision as requested by patient.  The  incision was extended through the subcutaneous layer and also the fascia with the bovie. Small perforators in the subcutaneous layer were contained with the Bovie. The fascia was nicked in the midline and then was further separated from the rectus muscles bilaterally using Mayo scissors. Kochers were placed inferiorly and then superiorly to allow further separation of fascia from the rectus muscles with Mayo scissors.  The peritoneal cavity was entered bluntly and stretched out.  The Alexis retractor was placed in. The vesicouterine fold dissection was created using Metzenbaum scissors.  The uterus was incised with a scalpel transversely and the incision was extended bluntly bilaterally with fingers and bandage scissors.  Membranes were ruptured and moderate clear amniotic fluid was noted.  The head was delivered with head flexion and fundal pressure then the rest of the body was delivered with fundal pressure.  She delivered a viable female infant, apgar scores 8, 9.  The edges of the uterus was grasped with T clamps.  The cord was clamped and cut after 1 minute. Cord blood was collected.    The uterus was not exteriorized.  The placenta was delivered with gentle traction on the umbilical cord.  The uterus was cleared of clots and debris with a lap.  The uterine incision was closed with #1 Vicryl in a running locked stitch. A small area that bled on the right  side was contained with figure of 8 stitch.  A lap was placed over the incision and attention turned to the tubes.    The right  fallopian tube was then identified, brought  to the incision and followed up to the fimbria end.    The hand held Ligasure compact dissector was used to completely transect the fallopian tube from the mesosalpinx leaving about a 1 cm cornual stump.  Excellent hemostasis was noted on the remaining pedicles.  A similar procedure was done to remove the left fallopian tube.  Both uterine cornua and remaining mesosalpinx were hemostatic.   Irrigation was applied and suctioned out.  The uterine incision was noted to be hemostatic. Alexis retractor was removed and the peritoneum then the rectus muscle was then reapproximated using 2-0 chromic suture.  Fascia was closed using 0 looped PDS.  The subcutaneous layer was irrigated and suctioned out. Small perforators were contained with the bovie.  The subcutaneous layer was closed using 1-0 plain in interrupted stitches.  4- 0 vicryl was used to close the incision in a subcuticular stitch.  Patient was cleaned and dried.  Benzocaine, steri strips and honey comb dressings were placed over the incision.  She was further cleaned and then taken to the recovery room with her baby in stable conditions.  The blood collected in the cell saver could be autotransfused as it was a limited amount.    SPECIMEN:  Placenta to labor and delivery.  Umbilical cord blood to lab.  Left and right fallopian tubes to pathology. .   DISPOSITION: TO PACU, STABLE.   Dr. Jerolyn Foil.  Date:11/17/23.

## 2023-11-17 NOTE — Anesthesia Preprocedure Evaluation (Addendum)
 Anesthesia Evaluation  Patient identified by MRN, date of birth, ID band Patient awake    Reviewed: Allergy & Precautions, NPO status , Patient's Chart, lab work & pertinent test results  Airway Mallampati: II  TM Distance: >3 FB Neck ROM: Full    Dental no notable dental hx.    Pulmonary Current Smoker   Pulmonary exam normal        Cardiovascular negative cardio ROS  Rhythm:Regular Rate:Normal     Neuro/Psych    Depression    negative neurological ROS     GI/Hepatic negative GI ROS, Neg liver ROS,,,  Endo/Other  negative endocrine ROS    Renal/GU negative Renal ROS  negative genitourinary   Musculoskeletal negative musculoskeletal ROS (+)    Abdominal Normal abdominal exam  (+)   Peds  Hematology  (+) Blood dyscrasia, anemia Lab Results      Component                Value               Date                      WBC                      8.6                 11/15/2023                HGB                      8.8 (L)             11/15/2023                HCT                      30.4 (L)            11/15/2023                MCV                      71.0 (L)            11/15/2023                PLT                      255                 11/15/2023              Anesthesia Other Findings   Reproductive/Obstetrics (+) Pregnancy                             Anesthesia Physical Anesthesia Plan  ASA: 2  Anesthesia Plan: Spinal   Post-op Pain Management:    Induction:   PONV Risk Score and Plan: 1 and Ondansetron , Dexamethasone  and Treatment may vary due to age or medical condition  Airway Management Planned: Natural Airway  Additional Equipment: None  Intra-op Plan:   Post-operative Plan:   Informed Consent: I have reviewed the patients History and Physical, chart, labs and discussed the procedure including the risks, benefits and alternatives for the proposed anesthesia with the  patient or authorized representative who has indicated his/her  understanding and acceptance.     Dental advisory given  Plan Discussed with: CRNA  Anesthesia Plan Comments:        Anesthesia Quick Evaluation

## 2023-11-17 NOTE — Anesthesia Procedure Notes (Signed)
 Spinal  Patient location during procedure: OR Start time: 11/17/2023 3:47 PM End time: 11/17/2023 3:51 PM Staffing Performed: anesthesiologist  Anesthesiologist: Dorethea Cordella SQUIBB, DO Performed by: Dorethea Cordella SQUIBB, DO Authorized by: Dorethea Cordella SQUIBB, DO   Preanesthetic Checklist Completed: patient identified, IV checked, site marked, risks and benefits discussed, surgical consent, monitors and equipment checked, pre-op evaluation and timeout performed Spinal Block Patient position: sitting Prep: DuraPrep Patient monitoring: heart rate, cardiac monitor, continuous pulse ox and blood pressure Approach: midline Location: L3-4 Injection technique: single-shot Needle Needle type: Pencan  Needle gauge: 24 G Needle length: 10 cm Assessment Events: CSF return Additional Notes Patient identified. Risks/Benefits/Options discussed with patient including but not limited to bleeding, infection, nerve damage, paralysis, failed block, incomplete pain control, headache, blood pressure changes, nausea, vomiting, reactions to medications, itching and postpartum back pain. Confirmed with bedside nurse the patient's most recent platelet count. Confirmed with patient that they are not currently taking any anticoagulation, have any bleeding history or any family history of bleeding disorders. Patient expressed understanding and wished to proceed. All questions were answered. Sterile technique was used throughout the entire procedure. Please see nursing notes for vital signs. Warning signs of high block given to the patient including shortness of breath, tingling/numbness in hands, complete motor block, or any concerning symptoms with instructions to call for help. Patient was given instructions on fall risk and not to get out of bed. All questions and concerns addressed with instructions to call with any issues or inadequate analgesia.

## 2023-11-17 NOTE — Discharge Instructions (Signed)
 Itati, 1. Do not do any heavy lifting, i.e nothing heavier than 15 lbs for the next 6 weeks.  2.  Do not use tampons or douche or take baths, do not have any sexual intercourse or anything inside the vagina for the next 6 weeks.  3. Take your pain medication as needed for pain, let us  know if the pain is not well controlled despite pain medication use.  4. Take your iron  tablets daily for anemia.  You may also take a stool softener e.g colace if you are constipated.    5.  If you get a fever while at home, do check your temperature and if it is equal to or greater than 100.4 please call the office.   6. Some vaginal bleeding is expected and normal after your delivery. Please let us  know if if it excessive where you saturate 1 pad in less than 2 hours or so.  7. Please let us  know if with depression or anxiety symptoms, or symptoms of uncontrolled blood pressure such as headache, vision changes, nausea, vomiting, chest pain, shortness of breath. 8.  While at home remember to walk regularly, at least half an hour to1 hour a day in addition to your activities of daily living, as this will help with your quick recovery 9.  Remove the Honey comb dressing one week from your surgery date or on the date indicated on the dressing. To remove the honey comb dressing, first take a shower.  After showering pat the dressing dry then peel it off gently from the corners.  After honey comb comes out there will be steri strips left on the incision.  You may continue showering as usual and the steri strips may get wet.  After showering pat the steri strips dry. Allow the steri strips to fall off by themselves over time.  Do not rub the incision directly or put soap directly over the incision.  Always rinse off any soap over the incision and pat the incision dry.   10.  Please call use if with any questions or concerns.   [facility] Phone: [phone].

## 2023-11-17 NOTE — Anesthesia Postprocedure Evaluation (Signed)
 Anesthesia Post Note  Patient: Annette Archer  Procedure(s) Performed: CESAREAN SECTION WITH BILATERAL TUBAL LIGATION APPLICATION OF CELL SAVER     Patient location during evaluation: Mother Baby Anesthesia Type: Spinal Level of consciousness: oriented and awake and alert Pain management: pain level controlled Vital Signs Assessment: post-procedure vital signs reviewed and stable Respiratory status: spontaneous breathing and respiratory function stable Cardiovascular status: blood pressure returned to baseline and stable Postop Assessment: no headache, no backache, no apparent nausea or vomiting and able to ambulate Anesthetic complications: no   No notable events documented.  Last Vitals:  Vitals:   11/17/23 1301 11/17/23 1743  BP: 128/72 112/65  Pulse: 92 79  Resp: 16 17  Temp: 36.6 C 36.5 C  SpO2: 98% 92%    Last Pain:  Vitals:   11/17/23 1743  TempSrc: Oral  PainSc: 0-No pain                 Cordella P Emmanuela Ghazi

## 2023-11-18 ENCOUNTER — Encounter (HOSPITAL_COMMUNITY): Payer: Self-pay | Admitting: Obstetrics & Gynecology

## 2023-11-18 LAB — CBC
HCT: 27.2 % — ABNORMAL LOW (ref 36.0–46.0)
Hemoglobin: 8.1 g/dL — ABNORMAL LOW (ref 12.0–15.0)
MCH: 20.6 pg — ABNORMAL LOW (ref 26.0–34.0)
MCHC: 29.8 g/dL — ABNORMAL LOW (ref 30.0–36.0)
MCV: 69 fL — ABNORMAL LOW (ref 80.0–100.0)
Platelets: 229 10*3/uL (ref 150–400)
RBC: 3.94 MIL/uL (ref 3.87–5.11)
RDW: 18.6 % — ABNORMAL HIGH (ref 11.5–15.5)
WBC: 7.8 10*3/uL (ref 4.0–10.5)
nRBC: 0.4 % — ABNORMAL HIGH (ref 0.0–0.2)

## 2023-11-18 MED ORDER — LACTATED RINGERS IV BOLUS
1000.0000 mL | Freq: Once | INTRAVENOUS | Status: AC
  Administered 2023-11-18: 1000 mL via INTRAVENOUS

## 2023-11-18 MED ORDER — WITCH HAZEL-GLYCERIN EX PADS
1.0000 | MEDICATED_PAD | CUTANEOUS | Status: DC | PRN
Start: 2023-11-18 — End: 2023-11-20

## 2023-11-18 MED ORDER — DIBUCAINE (PERIANAL) 1 % EX OINT: 1.0000 | TOPICAL_OINTMENT | CUTANEOUS | Status: DC | PRN

## 2023-11-18 NOTE — Progress Notes (Signed)
 Subjective: Postpartum Day 1: Cesarean Delivery Patient reports incisional pain and tolerating PO.  Foley is stil in place urine I concentrated. Pt had nausea and emesis overnight but it has resolved and she tolerated breakfast. She desires circumcision of newborn.   Objective: Vital signs in last 24 hours: Temp:  [97.5 F (36.4 C)-98.6 F (37 C)] 98.4 F (36.9 C) (01/11 0631) Pulse Rate:  [62-93] 93 (01/11 0631) Resp:  [12-18] 14 (01/10 2109) BP: (105-128)/(49-75) 111/66 (01/11 0631) SpO2:  [92 %-98 %] 97 % (01/11 0631) Weight:  [96 kg] 96 kg (01/10 1310)  Physical Exam:  General: alert, cooperative, and no distress Lochia: appropriate Uterine Fundus: firm Incision: healing well DVT Evaluation: No evidence of DVT seen on physical exam.  Recent Labs    11/18/23 0523  HGB 8.1*  HCT 27.2*    Assessment/Plan: Status post Cesarean section. Doing well postoperatively.  Continue current care. -Decreased urine output- improvin po hydration encouraged.  Remove foley this afternoon if UOP greater than 30 cc per  hour.  -Encourage ambulation.  -Circumcision r/b/a discussed. Pt desires to have circumcision performed.  -Iron  deficiency anemia. Hgb is stable. Pt is asymptomatic  Pt is unable to tolerate po iron . She is offered iron  infusion but declines.   Rexene Hoit, MD 11/18/2023, 11:38 AM

## 2023-11-18 NOTE — Progress Notes (Addendum)
 MOB was referred for history of depression and ADHD.   Consult screened out due to ADHD does not warrant a CSW consult and none of the following criteria appear to apply: ~ History of anxiety/depression during this pregnancy, or of post-partum depression following prior delivery. ~ Diagnosis of anxiety and/or depression within last 3 years. CSW notes a diagnosis date of 2016 for diagnoses of ODD and depression.  OR * MOB's symptoms currently being treated with medication and/or therapy. Please contact the Clinical Social Worker if needs arise, by [facility] request, or if MOB scores greater than 9/yes to question 10 on Edinburgh Postpartum Depression Screen.  Signed,  Sharyne LOIS Roulette, MSW, LCSWA, LCASA Mar 22, 2024 1:58 PM

## 2023-11-19 LAB — BPAM RBC
Blood Product Expiration Date: 202502082359
Blood Product Expiration Date: 202502082359
Unit Type and Rh: 6200
Unit Type and Rh: 6200

## 2023-11-19 LAB — TYPE AND SCREEN
ABO/RH(D): A POS
Antibody Screen: NEGATIVE
Unit division: 0
Unit division: 0

## 2023-11-19 NOTE — Progress Notes (Signed)
 Subjective: Postpartum Day 2: Cesarean Delivery Patient reports incisional pain, tolerating PO, and no problems voiding.    Objective: Vital signs in last 24 hours: Temp:  [97.8 F (36.6 C)-98.7 F (37.1 C)] 97.8 F (36.6 C) (01/12 0535) Pulse Rate:  [70-82] 74 (01/12 0535) Resp:  [16-18] 16 (01/12 0535) BP: (115-132)/(47-76) 119/65 (01/12 0535) SpO2:  [97 %-100 %] 99 % (01/12 0535)  Physical Exam:  General: alert, cooperative, and no distress Lochia: appropriate Uterine Fundus: firm Incision: healing well DVT Evaluation: No evidence of DVT seen on physical exam.  Recent Labs    11/18/23 0523  HGB 8.1*  HCT 27.2*    Assessment/Plan: Status post Cesarean section.  Pain is not well controlled. - motrin  scheduled. Oxycodone  5-10 mg every 4 hours as needed.  Encouraged ambulation.   Continue current care. Circumcision of newborn completed 11/18/2023 Discharge home 11/20/2023  Rexene Hoit, MD 11/19/2023, 9:34 AM

## 2023-11-20 MED ORDER — IBUPROFEN 600 MG PO TABS: 600.0000 mg | ORAL_TABLET | Freq: Four times a day (QID) | ORAL | 1 refills | Status: AC | PRN

## 2023-11-20 MED ORDER — ACETAMINOPHEN 500 MG PO TABS: 1000.0000 mg | ORAL_TABLET | Freq: Four times a day (QID) | ORAL | 0 refills | Status: AC | PRN

## 2023-11-20 MED ORDER — OXYCODONE HCL 5 MG PO TABS: 5.0000 mg | ORAL_TABLET | Freq: Four times a day (QID) | ORAL | 0 refills | Status: AC | PRN

## 2023-11-20 NOTE — Lactation Note (Signed)
 This note was copied from a baby's chart. Lactation Consultation Note  Patient Name: Annette Archer Unijb'd Date: 11/20/2023 Age:25 hours  Reason for consult: Initial assessment;Mother's request;Term  Mother has requested to see a Lactation Consultant because she would like information regarding getting a DEBP for home. Mother states breastfeeding is going very well. She reports she is leaking milk and has a history of a good milk supply. Mother is also formula feeding by choice and plans to continue to do both. She said it has worked in the past for her kids. She indicates she could provide breast milk longer and when the baby is not with her, if she has a breast pump.   Mother has been given a hand pump. This LC submitted a request for a Stork pump ( pending). Mother states she's ready to go home and may not be here if it comes. She was given a resource sheet for her to call about getting a pump. And lastly, mother says she has [facility] and thinks she can get one with them. Mother is very pleasant but ready to go home.    Maternal Data Has patient been taught Hand Expression?: No Does the patient have breastfeeding experience prior to this delivery?: Yes How long did the patient breastfeed?: 6 weeks to 9 months  Feeding Mother's Current Feeding Choice: Breast Milk and Formula Nipple Type: Slow - flow   Interventions  Have submitted for a Stork pump  Discharge Discharge Education: Engorgement and breast care;Warning signs for feeding baby Pump: Manual  Consult Status Consult Status: Complete    Joshua Rojelio HERO 11/20/2023, 12:09 PM

## 2023-11-20 NOTE — Discharge Summary (Signed)
 Postpartum Discharge Summary  Date of Service updated 11-20-23     Patient Name: Annette Archer DOB: [birth year] MRN: [id number]  Date of admission: 11/17/2023 Delivery date:11/17/2023 Delivering provider: GLORIANN Archer Date of discharge: 11/20/2023  Admitting diagnosis: Other specified postprocedural states [Z98.890] Indication for care in labor or delivery [O75.9] Intrauterine pregnancy: [redacted]w[redacted]d     Secondary diagnosis:  Principal Problem:   Other specified postprocedural states Active Problems:   Indication for care in labor or delivery  Additional problems: none    Discharge diagnosis: Term Pregnancy Delivered                                              Post partum procedures: none Augmentation: N/A Complications: None  Hospital course: Sceduled C/S   23 y.o. yo G3P3003 at [redacted]w[redacted]d was admitted to the hospital 11/17/2023 for scheduled cesarean section with the following indication:Elective Repeat.Delivery details are as follows:  Membrane Rupture Time/Date: 4:25 PM,11/17/2023  Delivery Method:C-Section, Low Transverse Operative Delivery:N/A Details of operation can be found in separate operative note.  Patient had a postpartum course complicated by nothing.  She is ambulating, tolerating a regular diet, passing flatus, and urinating well. Patient is discharged home in stable condition on  11/20/23        Newborn Data: Birth date:11/17/2023 Birth time:4:26 PM Gender:Female Living status:Living Apgars:8 ,9  Weight:4180 g    Magnesium  Sulfate received: No BMZ received: No Rhophylac:No  Transfusion:No Immunizations administered: There is no immunization history for the selected administration types on file for this patient.  Physical exam  Vitals:   11/19/23 0535 11/19/23 1546 11/19/23 2127 11/20/23 0535  BP: 119/65 (!) 141/75 134/78 116/74  Pulse: 74 98 84 91  Resp: 16 17 18 18   Temp: 97.8 F (36.6 C) 97.6 F (36.4 C) 98.2 F (36.8 C) 98 F (36.7 C)  TempSrc: Oral  Oral Oral Oral  SpO2: 99% 99% 99% 99%  Weight:      Height:       General: alert, cooperative, and no distress Lochia: appropriate Uterine Fundus: FF,NT Incision: dressing intact with small amount of staining DVT Evaluation: no calf tenderness Labs: Lab Results  Component Value Date   WBC 7.8 11/18/2023   HGB 8.1 (L) 11/18/2023   HCT 27.2 (L) 11/18/2023   MCV 69.0 (L) 11/18/2023   PLT 229 11/18/2023      Latest Ref Rng & Units 07/22/2022    9:46 AM  CMP  Glucose 70 - 99 mg/dL 77   BUN 6 - 20 mg/dL 5   Creatinine 9.55 - 8.99 mg/dL 9.40   Sodium 864 - 854 mmol/L 135   Potassium 3.5 - 5.1 mmol/L 3.4   Chloride 98 - 111 mmol/L 105   CO2 22 - 32 mmol/L 22   Calcium 8.9 - 10.3 mg/dL 8.4    Edinburgh Score:    11/18/2023   10:05 PM  Edinburgh Postnatal Depression Scale Screening Tool  I have been able to laugh and see the funny side of things. 0  I have looked forward with enjoyment to things. 0  I have blamed myself unnecessarily when things went wrong. 2  I have been anxious or worried for no good reason. 0  I have felt scared or panicky for no good reason. 0  Things have been getting on top of me. 2  I have  been so unhappy that I have had difficulty sleeping. 0  I have felt sad or miserable. 0  I have been so unhappy that I have been crying. 0  The thought of harming myself has occurred to me. 0  Edinburgh Postnatal Depression Scale Total 4      After visit meds:  Allergies as of 11/20/2023   No Known Allergies      Medication List     TAKE these medications    acetaminophen  500 MG tablet Commonly known as: TYLENOL  Take 2 tablets (1,000 mg total) by mouth every 6 (six) hours as needed. What changed: Another medication with the same name was added. Make sure you understand how and when to take each.   acetaminophen  500 MG tablet Commonly known as: TYLENOL  Take 2 tablets (1,000 mg total) by mouth every 6 (six) hours as needed. What changed: You were  already taking a medication with the same name, and this prescription was added. Make sure you understand how and when to take each.   ibuprofen  600 MG tablet Commonly known as: ADVIL  Take 1 tablet (600 mg total) by mouth every 6 (six) hours as needed for mild pain (pain score 1-3).   oxyCODONE  5 MG immediate release tablet Commonly known as: Oxy IR/ROXICODONE  Take 1-2 tablets (5-10 mg total) by mouth every 6 (six) hours as needed for severe pain (pain score 7-10).   PRENATAL GUMMIES PO Take 2 capsules by mouth daily.         Discharge home in stable condition Infant Feeding:  both Infant Disposition:home with mother Discharge instruction: per After Visit Summary and Postpartum booklet. Activity: Advance as tolerated. Pelvic rest for 6 weeks.  Diet: routine diet Anticipated Birth Control:  Bilateral Salpingectomy at C-Section Postpartum Appointment:6 weeks Additional Postpartum F/U: Incision check 2 weeks Future Appointments:No future appointments. Follow up Visit:  Follow-up Information     Annette Chick, MD. Schedule an appointment as soon as possible for a visit.   Specialty: Obstetrics and Gynecology Why: 2 weeks for incision check and 6 weeks for postpartum check. Contact information: [address] [place] [address] [phone]                     11/20/2023 Annette CINDERELLA Rummer, MD

## 2023-11-21 LAB — SURGICAL PATHOLOGY

## 2023-11-28 ENCOUNTER — Telehealth (HOSPITAL_COMMUNITY): Payer: Self-pay | Admitting: *Deleted

## 2023-11-28 NOTE — Telephone Encounter (Signed)
11/28/2023  Name: Annette Archer MRN: [id number] DOB: 12-26-2000  Reason for Call:  Transition of Care Hospital Discharge Call  Contact Status: Patient Contact Status: Complete (patient returned call)  Language assistant needed: Interpreter Mode: Interpreter Not Needed        Follow-Up Questions: Do You Have Any Concerns About Your Health As You Heal From Delivery?: No Do You Have Any Concerns About Your Infants Health?: No  Edinburgh Postnatal Depression Scale:  In the Past 7 Days:    PHQ2-9 Depression Scale:     Discharge Follow-up: Edinburgh score requires follow up?: N/A (Patient declined reviewing [place]; however, she states that she is doing great emotionally.) Patient was advised of the following resources:: Support Group, Breastfeeding Support Group  Post-discharge interventions: Reviewed Newborn Safe Sleep Practices  Maudry Diego, RN 11/28/2023 [phone]

## 2023-11-28 NOTE — Telephone Encounter (Signed)
11/28/2023  Name: Annette Archer MRN: [id number] DOB: 2001/04/10  Reason for Call:  Transition of Care Hospital Discharge Call  Contact Status: Patient Contact Status: Unable to contact ("call cannot be completed at this time")  Language assistant needed:          Follow-Up Questions:    Inocente Salles Postnatal Depression Scale:  In the Past 7 Days:    PHQ2-9 Depression Scale:     Discharge Follow-up:    Post-discharge interventions: NA  Maudry Diego, RN 11/28/2023 1536
